# Patient Record
Sex: Male | Born: 1950
Health system: Southern US, Community
[De-identification: ages and names within clinical notes are randomized; demographics above are authoritative.]

## PROBLEM LIST (undated history)

## (undated) DIAGNOSIS — G629 Polyneuropathy, unspecified: Secondary | ICD-10-CM

## (undated) DIAGNOSIS — E785 Hyperlipidemia, unspecified: Secondary | ICD-10-CM

## (undated) DIAGNOSIS — I10 Essential (primary) hypertension: Secondary | ICD-10-CM

## (undated) DIAGNOSIS — M109 Gout, unspecified: Secondary | ICD-10-CM

## (undated) DIAGNOSIS — E119 Type 2 diabetes mellitus without complications: Secondary | ICD-10-CM

## (undated) HISTORY — DX: Polyneuropathy, unspecified: G62.9

## (undated) HISTORY — PX: CHOLECYSTECTOMY: SHX55

## (undated) HISTORY — PX: APPENDECTOMY: SHX54

## (undated) HISTORY — DX: Gout, unspecified: M10.9

## (undated) HISTORY — PX: TONSILLECTOMY: SUR1361

## (undated) HISTORY — PX: GANGLION CYST EXCISION: SHX1691

## (undated) HISTORY — PX: KNEE ARTHROSCOPY: SUR90

---

## 2001-08-18 ENCOUNTER — Encounter: Payer: Self-pay | Admitting: *Deleted

## 2001-08-18 ENCOUNTER — Ambulatory Visit (HOSPITAL_COMMUNITY): Admission: RE | Admit: 2001-08-18 | Discharge: 2001-08-18 | Payer: Self-pay | Admitting: *Deleted

## 2001-09-14 ENCOUNTER — Ambulatory Visit (HOSPITAL_BASED_OUTPATIENT_CLINIC_OR_DEPARTMENT_OTHER): Admission: RE | Admit: 2001-09-14 | Discharge: 2001-09-14 | Payer: Self-pay | Admitting: *Deleted

## 2003-04-30 ENCOUNTER — Encounter: Payer: Self-pay | Admitting: Family Medicine

## 2003-04-30 ENCOUNTER — Ambulatory Visit (HOSPITAL_COMMUNITY): Admission: RE | Admit: 2003-04-30 | Discharge: 2003-04-30 | Payer: Self-pay | Admitting: Family Medicine

## 2003-11-03 HISTORY — PX: COLONOSCOPY: SHX174

## 2003-12-07 ENCOUNTER — Ambulatory Visit (HOSPITAL_COMMUNITY): Admission: RE | Admit: 2003-12-07 | Discharge: 2003-12-07 | Payer: Self-pay | Admitting: Family Medicine

## 2004-01-14 ENCOUNTER — Ambulatory Visit (HOSPITAL_COMMUNITY): Admission: RE | Admit: 2004-01-14 | Discharge: 2004-01-14 | Payer: Self-pay | Admitting: Internal Medicine

## 2006-06-29 ENCOUNTER — Ambulatory Visit (HOSPITAL_COMMUNITY): Admission: RE | Admit: 2006-06-29 | Discharge: 2006-06-29 | Payer: Self-pay | Admitting: Orthopaedic Surgery

## 2014-03-29 ENCOUNTER — Telehealth: Payer: Self-pay

## 2014-03-29 NOTE — Telephone Encounter (Signed)
Pt received a letter in the mail for him to have a TCS. His call back number is [phone].

## 2014-03-29 NOTE — Telephone Encounter (Signed)
LMOM to call.

## 2014-03-30 NOTE — Telephone Encounter (Signed)
Pt is having some diarrhea. Ov with Laban Emperor, NP on 05/03/2014 and to schedule next colonoscopy.

## 2014-05-03 ENCOUNTER — Ambulatory Visit: Payer: Managed Care, Other (non HMO) | Admitting: Gastroenterology

## 2014-05-03 ENCOUNTER — Telehealth: Payer: Self-pay

## 2014-05-03 ENCOUNTER — Other Ambulatory Visit: Payer: Self-pay

## 2014-05-03 ENCOUNTER — Encounter: Payer: Self-pay | Admitting: Gastroenterology

## 2014-05-03 ENCOUNTER — Encounter (INDEPENDENT_AMBULATORY_CARE_PROVIDER_SITE_OTHER): Payer: Self-pay

## 2014-05-03 VITALS — BP 136/76 | HR 56 | Temp 96.8°F | Ht 71.0 in | Wt 227.0 lb

## 2014-05-03 DIAGNOSIS — Z1211 Encounter for screening for malignant neoplasm of colon: Secondary | ICD-10-CM

## 2014-05-03 MED ORDER — PEG-KCL-NACL-NASULF-NA ASC-C 100 G PO SOLR: 1.0000 | ORAL | Status: DC

## 2014-05-03 NOTE — Progress Notes (Signed)
Bruce Abbott presents today to schedule a routine screening colonoscopy. Last in 2005 by Dr. Gala Romney with diminutive rectal polyp, internal hemorrhoids, left-sided diverticula. Path not available at time of visit. He has no problems and wonders why he needed a visit today for a routine 10-year-screening colonoscopy. Apparently, he has had looser stool since his cholecystectomy, but this is unchanged. He has no rectal bleeding, abdominal pain, or any significant change in bowel habits. He started on Metformin with his PCP several months ago, which has affected his overall bowel pattern, which is likely a class side effect. He has no concerning signs/symptoms.   Discussed with patient briefly today. Will triage as he is straight-forward and without any new problems. No charge.  Orvil Feil, ANP-BC [facility]

## 2014-05-03 NOTE — Addendum Note (Signed)
Addended by: Everardo All on: 05/03/2014 10:32 AM   Modules accepted: Orders

## 2014-05-03 NOTE — Telephone Encounter (Signed)
Rx sent and instructions given to pt.

## 2014-05-03 NOTE — Telephone Encounter (Signed)
Gastroenterology Pre-Procedure Review  Request Date: 05/03/2014 Requesting Physician: On Recall  Pt's last colonoscopy was 01/2004 by Dr. Gala Romney  PATIENT REVIEW QUESTIONS: The patient responded to the following health history questions as indicated:    1. Diabetes Melitis: YES 2. Joint replacements in the past 12 months: no 3. Major health problems in the past 3 months: no 4. Has an artificial valve or MVP: no 5. Has a defibrillator: no 6. Has been advised in past to take antibiotics in advance of a procedure like teeth cleaning: no    MEDICATIONS & ALLERGIES:    Patient reports the following regarding taking any blood thinners:   Plavix? no Aspirin? YES Coumadin? NO  Patient confirms/reports the following medications:  Current Outpatient Prescriptions  Medication Sig Dispense Refill  . allopurinol (ZYLOPRIM) 100 MG tablet Take 100 mg by mouth daily.      Marland Kitchen aspirin EC 81 MG tablet Take 81 mg by mouth daily.      . Fish Oil-Krill Oil (KRILL OIL PLUS PO) Take by mouth. Takes 100 mg bid      . lisinopril-hydrochlorothiazide (PRINZIDE,ZESTORETIC) 10-12.5 MG per tablet Take 1 tablet by mouth daily.      . metFORMIN (GLUMETZA) 500 MG (MOD) 24 hr tablet Take 500 mg by mouth 2 (two) times daily with a meal.      . Omega-3 Fatty Acids (FISH OIL) 1000 MG CAPS Take by mouth.      . pravastatin (PRAVACHOL) 10 MG tablet Take 10 mg by mouth daily.      . peg 3350 powder (MOVIPREP) 100 G SOLR Take 1 kit (200 g total) by mouth as directed.  1 kit  0   No current facility-administered medications for this visit.    Patient confirms/reports the following allergies:  No Known Allergies  No orders of the defined types were placed in this encounter.    AUTHORIZATION INFORMATION Primary Insurance:   ID #:   Group #:  Pre-Cert / Auth required:  Pre-Cert / Auth #:   Secondary Insurance:  ID #:   Group #:  Pre-Cert / Auth required:  Pre-Cert / Auth #:   SCHEDULE INFORMATION: Procedure has  been scheduled as follows:  Date:  05/29/2014        Time: 8:45 AM Location: [facility] Short Stay  This Gastroenterology Pre-Precedure Review Form is being routed to the following provider(s): R. Garfield Cornea, MD

## 2014-05-03 NOTE — Telephone Encounter (Signed)
Appropriate. No diabetes medications day of procedure.

## 2014-05-03 NOTE — Patient Instructions (Signed)
We have scheduled you for a colonoscopy with Dr. Rourk.  Further recommendations to follow!   

## 2014-05-24 ENCOUNTER — Telehealth: Payer: Self-pay

## 2014-05-24 NOTE — Telephone Encounter (Signed)
I called to see if pt's insurance required a PA for screening colonoscopy to be done on 05/29/2014 . I called [employer] at [phone] and was told they could not locate him.  His number that I have is [id number].  I called and left message on his Vm for a return call this afternoon before 5:00 pm or tomorrow before noon so I can confirm his insurance. Need to know where he works also.

## 2014-05-25 ENCOUNTER — Encounter (HOSPITAL_COMMUNITY): Payer: Self-pay | Admitting: Pharmacy Technician

## 2014-05-25 NOTE — Telephone Encounter (Signed)
I called [employer] and got the automation. PA not required for screening colonoscopy. Reference # [id number].

## 2014-06-05 ENCOUNTER — Telehealth: Payer: Self-pay

## 2014-06-05 NOTE — Telephone Encounter (Signed)
I called pt to update medication list. He is now taking Zetia 10 mg instead of the Pravastatin. Changes made on the med list.

## 2014-06-05 NOTE — Telephone Encounter (Signed)
Appropriate. No diabetes medications the day of the procedure.  

## 2014-06-06 NOTE — Telephone Encounter (Signed)
Pt aware.

## 2014-06-11 NOTE — Progress Notes (Signed)
Patient usually takes Metformin in afternoon and at night. Per Dr. Gala Romney patient may take afternoon dose with ample amounts of hydration (with sugar, if needed.) Patient to monitor blood glucose. Skip night time dose of Metformin. No other orders at this time.

## 2014-06-12 ENCOUNTER — Encounter (HOSPITAL_COMMUNITY)
Admission: RE | Disposition: A | Discharge status: Home / Self Care | Payer: Self-pay | Source: Ambulatory Visit | Attending: Internal Medicine

## 2014-06-12 ENCOUNTER — Ambulatory Visit (HOSPITAL_COMMUNITY)
Admission: RE | Admit: 2014-06-12 | Discharge: 2014-06-12 | Disposition: A | Discharge status: Home / Self Care | Payer: Managed Care, Other (non HMO) | Source: Ambulatory Visit | Attending: Internal Medicine | Admitting: Internal Medicine

## 2014-06-12 ENCOUNTER — Encounter (HOSPITAL_COMMUNITY): Payer: Self-pay | Admitting: *Deleted

## 2014-06-12 DIAGNOSIS — K621 Rectal polyp: Secondary | ICD-10-CM

## 2014-06-12 DIAGNOSIS — D126 Benign neoplasm of colon, unspecified: Secondary | ICD-10-CM | POA: Insufficient documentation

## 2014-06-12 DIAGNOSIS — Z1211 Encounter for screening for malignant neoplasm of colon: Secondary | ICD-10-CM | POA: Insufficient documentation

## 2014-06-12 DIAGNOSIS — K5730 Diverticulosis of large intestine without perforation or abscess without bleeding: Secondary | ICD-10-CM | POA: Diagnosis not present

## 2014-06-12 DIAGNOSIS — K620 Anal polyp: Secondary | ICD-10-CM

## 2014-06-12 HISTORY — DX: Hyperlipidemia, unspecified: E78.5

## 2014-06-12 HISTORY — DX: Type 2 diabetes mellitus without complications: E11.9

## 2014-06-12 HISTORY — DX: Essential (primary) hypertension: I10

## 2014-06-12 HISTORY — PX: COLONOSCOPY: SHX5424

## 2014-06-12 LAB — GLUCOSE, CAPILLARY: GLUCOSE-CAPILLARY: 142 mg/dL — AB (ref 70–99)

## 2014-06-12 SURGERY — COLONOSCOPY
Anesthesia: Moderate Sedation

## 2014-06-12 MED ORDER — MIDAZOLAM HCL 5 MG/5ML IJ SOLN
INTRAMUSCULAR | Status: DC | PRN
  Administered 2014-06-12: 2 mg via INTRAVENOUS

## 2014-06-12 MED ORDER — SIMETHICONE 40 MG/0.6ML PO SUSP
ORAL | Status: DC | PRN
  Administered 2014-06-12: 08:00:00

## 2014-06-12 MED ORDER — LIDOCAINE VISCOUS 2 % MT SOLN
OROMUCOSAL | Status: AC
  Filled 2014-06-12: qty 15

## 2014-06-12 MED ORDER — MEPERIDINE HCL 100 MG/ML IJ SOLN
INTRAMUSCULAR | Status: AC
  Filled 2014-06-12: qty 2

## 2014-06-12 MED ORDER — MIDAZOLAM HCL 5 MG/5ML IJ SOLN
INTRAMUSCULAR | Status: AC
  Filled 2014-06-12: qty 10

## 2014-06-12 MED ORDER — ONDANSETRON HCL 4 MG/2ML IJ SOLN
INTRAMUSCULAR | Status: DC
Start: 2014-06-12 — End: 2014-06-12
  Filled 2014-06-12: qty 2

## 2014-06-12 MED ORDER — MEPERIDINE HCL 100 MG/ML IJ SOLN
INTRAMUSCULAR | Status: DC | PRN
  Administered 2014-06-12: 50 mg via INTRAVENOUS

## 2014-06-12 MED ORDER — SODIUM CHLORIDE 0.9 % IV SOLN
INTRAVENOUS | Status: DC
  Administered 2014-06-12: 07:00:00 via INTRAVENOUS

## 2014-06-12 MED ORDER — ONDANSETRON HCL 4 MG/2ML IJ SOLN
INTRAMUSCULAR | Status: DC | PRN
  Administered 2014-06-12: 4 mg via INTRAVENOUS

## 2014-06-12 NOTE — H&P (Signed)
_0 @   Primary Care Physician:  Glo Herring., MD Primary Gastroenterologist:  Dr. Gala Romney  Pre-Procedure History & Physical: HPI:  Bruce Abbott is a 63 y.o. male is here for a screening colonoscopy. Negative colonoscopy 10 years ago. No bowel symptoms. No family history of colon cancer.  Past Medical History  Diagnosis Date  . Diabetes mellitus without complication   . Hypertension   . Hyperlipemia     Past Surgical History  Procedure Laterality Date  . Colonoscopy  2005    Dr. Gala Romney: internal hemorrhoids, diminutive rectal polyp, path not available. Left-sided diverticula  . Appendectomy    . Tonsillectomy    . Cholecystectomy    . Knee arthroscopy Left   . Ganglion cyst excision      Prior to Admission medications   Medication Sig Start Date End Date Taking? Authorizing Provider  allopurinol (ZYLOPRIM) 300 MG tablet Take 300 mg by mouth daily.   Yes Historical Provider, MD  aspirin EC 81 MG tablet Take 81 mg by mouth daily.   Yes Historical Provider, MD  Coenzyme Q10 (CO Q 10) 100 MG CAPS Take 1 capsule by mouth daily.   Yes Historical Provider, MD  ezetimibe (ZETIA) 10 MG tablet Take 10 mg by mouth daily.   Yes Historical Provider, MD  lisinopril-hydrochlorothiazide (PRINZIDE,ZESTORETIC) 10-12.5 MG per tablet Take 1 tablet by mouth daily.   Yes Historical Provider, MD  metFORMIN (GLUMETZA) 500 MG (MOD) 24 hr tablet Take 500 mg by mouth 2 (two) times daily with a meal.   Yes Historical Provider, MD  Omega-3 Fatty Acids (FISH OIL) 1200 MG CAPS Take 1 capsule by mouth daily.   Yes Historical Provider, MD  Omega-3 Krill Oil 500 MG CAPS Take 2 capsules by mouth daily.   Yes Historical Provider, MD  peg 3350 powder (MOVIPREP) 100 G SOLR Take 1 kit (200 g total) by mouth as directed. 05/03/14  Yes Daneil Dolin, MD  pravastatin (PRAVACHOL) 20 MG tablet Take 20 mg by mouth daily.   Yes Historical Provider, MD    Allergies as of 05/03/2014  . (No Known Allergies)     History reviewed. No pertinent family history.  History   Social History  . Marital Status: Married    Spouse Name: N/A    Number of Children: N/A  . Years of Education: N/A   Occupational History  . Not on file.   Social History Main Topics  . Smoking status: Former Research scientist (life sciences)  . Smokeless tobacco: Not on file     Comment: Quit x 7-8 years  . Alcohol Use: Yes     Comment: occ  . Drug Use: No  . Sexual Activity: Not on file   Other Topics Concern  . Not on file   Social History Narrative  . No narrative on file    Review of Systems: See HPI, otherwise negative ROS  Physical Exam: BP 115/68  Pulse 55  Temp(Src) 97.9 F (36.6 C) (Oral)  Resp 16  SpO2 96% General:   Alert,  Well-developed, well-nourished, pleasant and cooperative in NAD Head:  Normocephalic and atraumatic. Eyes:  Sclera clear, no icterus.   Conjunctiva pink. Ears:  Normal auditory acuity. Nose:  No deformity, discharge,  or lesions. Mouth:  No deformity or lesions, dentition normal. Neck:  Supple; no masses or thyromegaly. Lungs:  Clear throughout to auscultation.   No wheezes, crackles, or rhonchi. No acute distress. Heart:  Regular rate and rhythm; no murmurs, clicks, rubs,  or  gallops. Abdomen:  Soft, nontender and nondistended. No masses, hepatosplenomegaly or hernias noted. Normal bowel sounds, without guarding, and without rebound.   Msk:  Symmetrical without gross deformities. Normal posture. Pulses:  Normal pulses noted. Extremities:  Without clubbing or edema. Neurologic:  Alert and  oriented x4;  grossly normal neurologically. Skin:  Intact without significant lesions or rashes. Cervical Nodes:  No significant cervical adenopathy. Psych:  Alert and cooperative. Normal mood and affect.  Impression/Plan: [name] is now here to undergo a screening colonoscopy.  Average risk screening examination  Risks, benefits, limitations, imponderables and alternatives regarding colonoscopy  have been reviewed with the patient. Questions have been answered. All parties agreeable.     Notice:  This dictation was prepared with Dragon dictation along with smaller phrase technology. Any transcriptional errors that result from this process are unintentional and may not be corrected upon review.

## 2014-06-12 NOTE — Discharge Instructions (Signed)
Colonoscopy Discharge Instructions  Read the instructions outlined below and refer to this sheet in the next few weeks. These discharge instructions provide you with general information on caring for yourself after you leave the hospital. Your doctor may also give you specific instructions. While your treatment has been planned according to the most current medical practices available, unavoidable complications occasionally occur. If you have any problems or questions after discharge, call Dr. Gala Romney at [phone]. ACTIVITY  You may resume your regular activity, but move at a slower pace for the next 24 hours.   Take frequent rest periods for the next 24 hours.   Walking will help get rid of the air and reduce the bloated feeling in your belly (abdomen).   No driving for 24 hours (because of the medicine (anesthesia) used during the test).    Do not sign any important legal documents or operate any machinery for 24 hours (because of the anesthesia used during the test).  NUTRITION  Drink plenty of fluids.   You may resume your normal diet as instructed by your doctor.   Begin with a light meal and progress to your normal diet. Heavy or fried foods are harder to digest and may make you feel sick to your stomach (nauseated).   Avoid alcoholic beverages for 24 hours or as instructed.  MEDICATIONS  You may resume your normal medications unless your doctor tells you otherwise.  WHAT YOU CAN EXPECT TODAY  Some feelings of bloating in the abdomen.   Passage of more gas than usual.   Spotting of blood in your stool or on the toilet paper.  IF YOU HAD POLYPS REMOVED DURING THE COLONOSCOPY:  No aspirin products for 7 days or as instructed.   No alcohol for 7 days or as instructed.   Eat a soft diet for the next 24 hours.  FINDING OUT THE RESULTS OF YOUR TEST Not all test results are available during your visit. If your test results are not back during the visit, make an appointment  with your caregiver to find out the results. Do not assume everything is normal if you have not heard from your caregiver or the medical facility. It is important for you to follow up on all of your test results.  SEEK IMMEDIATE MEDICAL ATTENTION IF:  You have more than a spotting of blood in your stool.   Your belly is swollen (abdominal distention).   You are nauseated or vomiting.   You have a temperature over 101.   You have abdominal pain or discomfort that is severe or gets worse throughout the day.     Diverticulosis and polyp information provided  Further recommendations to follow pending review of pathology  Diverticulosis Diverticulosis is the condition that develops when small pouches (diverticula) form in the wall of your colon. Your colon, or large intestine, is where water is absorbed and stool is formed. The pouches form when the inside layer of your colon pushes through weak spots in the outer layers of your colon. CAUSES  No one knows exactly what causes diverticulosis. RISK FACTORS  Being older than 70. Your risk for this condition increases with age. Diverticulosis is rare in people younger than 40 years. By age 67, almost everyone has it.  Eating a low-fiber diet.  Being frequently constipated.  Being overweight.  Not getting enough exercise.  Smoking.  Taking over-the-counter pain medicines, like aspirin and ibuprofen. SYMPTOMS  Most people with diverticulosis do not have symptoms. DIAGNOSIS  Because  diverticulosis often has no symptoms, health care providers often discover the condition during an exam for other colon problems. In many cases, a health care provider will diagnose diverticulosis while using a flexible scope to examine the colon (colonoscopy). TREATMENT  If you have never developed an infection related to diverticulosis, you may not need treatment. If you have had an infection before, treatment may include:  Eating more fruits,  vegetables, and grains.  Taking a fiber supplement.  Taking a live bacteria supplement (probiotic).  Taking medicine to relax your colon. HOME CARE INSTRUCTIONS   Drink at least 6-8 glasses of water each day to prevent constipation.  Try not to strain when you have a bowel movement.  Keep all follow-up appointments. If you have had an infection before:  Increase the fiber in your diet as directed by your health care provider or dietitian.  Take a dietary fiber supplement if your health care provider approves.  Only take medicines as directed by your health care provider. SEEK MEDICAL CARE IF:   You have abdominal pain.  You have bloating.  You have cramps.  You have not gone to the bathroom in 3 days. SEEK IMMEDIATE MEDICAL CARE IF:   Your pain gets worse.  Yourbloating becomes very bad.  You have a fever or chills, and your symptoms suddenly get worse.  You begin vomiting.  You have bowel movements that are bloody or black. MAKE SURE YOU:  Understand these instructions.  Will watch your condition.  Will get help right away if you are not doing well or get worse.  Colon Polyps Polyps are lumps of extra tissue growing inside the body. Polyps can grow in the large intestine (colon). Most colon polyps are noncancerous (benign). However, some colon polyps can become cancerous over time. Polyps that are larger than a pea may be harmful. To be safe, caregivers remove and test all polyps. CAUSES  Polyps form when mutations in the genes cause your cells to grow and divide even though no more tissue is needed. RISK FACTORS There are a number of risk factors that can increase your chances of getting colon polyps. They include:  Being older than 50 years.  Family history of colon polyps or colon cancer.  Long-term colon diseases, such as colitis or Crohn disease.  Being overweight.  Smoking.  Being inactive.  Drinking too much alcohol. SYMPTOMS  Most small  polyps do not cause symptoms. If symptoms are present, they may include:  Blood in the stool. The stool may look dark red or black.  Constipation or diarrhea that lasts longer than 1 week. DIAGNOSIS People often do not know they have polyps until their caregiver finds them during a regular checkup. Your caregiver can use 4 tests to check for polyps:  Digital rectal exam. The caregiver wears gloves and feels inside the rectum. This test would find polyps only in the rectum.  Barium enema. The caregiver puts a liquid called barium into your rectum before taking X-rays of your colon. Barium makes your colon look white. Polyps are dark, so they are easy to see in the X-ray pictures.  Sigmoidoscopy. A thin, flexible tube (sigmoidoscope) is placed into your rectum. The sigmoidoscope has a light and tiny camera in it. The caregiver uses the sigmoidoscope to look at the last third of your colon.  Colonoscopy. This test is like sigmoidoscopy, but the caregiver looks at the entire colon. This is the most common method for finding and removing polyps. TREATMENT  Any  polyps will be removed during a sigmoidoscopy or colonoscopy. The polyps are then tested for cancer. PREVENTION  To help lower your risk of getting more colon polyps:  Eat plenty of fruits and vegetables. Avoid eating fatty foods.  Do not smoke.  Avoid drinking alcohol.  Exercise every day.  Lose weight if recommended by your caregiver.  Eat plenty of calcium and folate. Foods that are rich in calcium include milk, cheese, and broccoli. Foods that are rich in folate include chickpeas, kidney beans, and spinach. HOME CARE INSTRUCTIONS Keep all follow-up appointments as directed by your caregiver. You may need periodic exams to check for polyps. SEEK MEDICAL CARE IF: You notice bleeding during a bowel movement.

## 2014-06-12 NOTE — Op Note (Signed)
[facility] [address] [place], [address]   COLONOSCOPY PROCEDURE REPORT  PATIENT: Bruce Abbott, Bruce Abbott  MR#:         [id number] BIRTHDATE: 11-Feb-1951 , 14  yrs. old GENDER: Male ENDOSCOPIST: R.  Garfield Cornea, MD FACP FACG REFERRED BY:  Kerin Perna, M.D. PROCEDURE DATE:  06/12/2014 PROCEDURE:     Colonoscopy with snare polypectomy and polyp ablation  INDICATIONS: Average risk colorectal cancer screening examination  INFORMED CONSENT:  The risks, benefits, alternatives and imponderables including but not limited to bleeding, perforation as well as the possibility of a missed lesion have been reviewed.  The potential for biopsy, lesion removal, etc. have also been discussed.  Questions have been answered.  All parties agreeable. Please see the history and physical in the medical record for more information.  MEDICATIONS: Versed 2 mg IV and Demerol 50 mg IV in divided doses. Zofran 4 mg IV.  DESCRIPTION OF PROCEDURE:  After a digital rectal exam was performed, the EG-[id number] ([id number]) and EC-3890Li ([id number]) colonoscope was advanced from the anus through the rectum and colon to the area of the cecum, ileocecal valve and appendiceal orifice. The cecum was deeply intubated.  These structures were well-seen and photographed for the record.  From the level of the cecum and ileocecal valve, the scope was slowly and cautiously withdrawn. The mucosal surfaces were carefully surveyed utilizing scope tip deflection to facilitate fold flattening as needed.  The scope was pulled down into the rectum where a thorough examination including retroflexion was performed.    FINDINGS:  Adequate preparation. (2)  4 mm polyps in the rectum at 6 cm from the anal verge. (1) diminutive polyp just inside the anal verge; otherwise, the remainder of the rectal mucosa appeared normal. The patient had scattered left-sided diverticula; the remainder of colonic mucosa appeared  normal.  THERAPEUTIC / DIAGNOSTIC MANEUVERS PERFORMED:  The 2 larger rectal polyp were removed with hot snare cautery technique. The diminutive polyp was ablated with the tip of a hot snare loop.  COMPLICATIONS: none  CECAL WITHDRAWAL TIME:  11 minutes  IMPRESSION:  Rectal polyps-treated/removed as described above. Colonic diverticulosis.  RECOMMENDATIONS: Followup on pathology.   _______________________________ eSigned:  R. Garfield Cornea, MD FACP [facility] 06/12/2014 8:24 AM   CC:

## 2014-06-13 ENCOUNTER — Encounter: Payer: Self-pay | Admitting: Internal Medicine

## 2014-06-15 ENCOUNTER — Encounter (HOSPITAL_COMMUNITY): Payer: Self-pay | Admitting: Internal Medicine

## 2014-12-11 DIAGNOSIS — Z6829 Body mass index (BMI) 29.0-29.9, adult: Secondary | ICD-10-CM | POA: Diagnosis not present

## 2014-12-11 DIAGNOSIS — E119 Type 2 diabetes mellitus without complications: Secondary | ICD-10-CM | POA: Diagnosis not present

## 2014-12-11 DIAGNOSIS — I10 Essential (primary) hypertension: Secondary | ICD-10-CM | POA: Diagnosis not present

## 2014-12-11 DIAGNOSIS — E782 Mixed hyperlipidemia: Secondary | ICD-10-CM | POA: Diagnosis not present

## 2015-02-14 DIAGNOSIS — D1039 Benign neoplasm of other parts of mouth: Secondary | ICD-10-CM | POA: Diagnosis not present

## 2015-02-14 DIAGNOSIS — K1329 Other disturbances of oral epithelium, including tongue: Secondary | ICD-10-CM | POA: Diagnosis not present

## 2015-02-14 DIAGNOSIS — K121 Other forms of stomatitis: Secondary | ICD-10-CM | POA: Diagnosis not present

## 2015-03-12 DIAGNOSIS — E119 Type 2 diabetes mellitus without complications: Secondary | ICD-10-CM | POA: Diagnosis not present

## 2015-03-12 DIAGNOSIS — I10 Essential (primary) hypertension: Secondary | ICD-10-CM | POA: Diagnosis not present

## 2015-03-12 DIAGNOSIS — Z23 Encounter for immunization: Secondary | ICD-10-CM | POA: Diagnosis not present

## 2015-03-12 DIAGNOSIS — Z6830 Body mass index (BMI) 30.0-30.9, adult: Secondary | ICD-10-CM | POA: Diagnosis not present

## 2015-03-12 DIAGNOSIS — E782 Mixed hyperlipidemia: Secondary | ICD-10-CM | POA: Diagnosis not present

## 2015-03-12 DIAGNOSIS — E669 Obesity, unspecified: Secondary | ICD-10-CM | POA: Diagnosis not present

## 2015-03-12 DIAGNOSIS — E6609 Other obesity due to excess calories: Secondary | ICD-10-CM | POA: Diagnosis not present

## 2015-04-17 DIAGNOSIS — E6609 Other obesity due to excess calories: Secondary | ICD-10-CM | POA: Diagnosis not present

## 2015-04-17 DIAGNOSIS — Z6830 Body mass index (BMI) 30.0-30.9, adult: Secondary | ICD-10-CM | POA: Diagnosis not present

## 2015-04-17 DIAGNOSIS — M109 Gout, unspecified: Secondary | ICD-10-CM | POA: Diagnosis not present

## 2015-04-17 DIAGNOSIS — I10 Essential (primary) hypertension: Secondary | ICD-10-CM | POA: Diagnosis not present

## 2015-06-17 DIAGNOSIS — E6609 Other obesity due to excess calories: Secondary | ICD-10-CM | POA: Diagnosis not present

## 2015-06-17 DIAGNOSIS — N182 Chronic kidney disease, stage 2 (mild): Secondary | ICD-10-CM | POA: Diagnosis not present

## 2015-06-17 DIAGNOSIS — E782 Mixed hyperlipidemia: Secondary | ICD-10-CM | POA: Diagnosis not present

## 2015-06-17 DIAGNOSIS — I10 Essential (primary) hypertension: Secondary | ICD-10-CM | POA: Diagnosis not present

## 2015-06-17 DIAGNOSIS — E119 Type 2 diabetes mellitus without complications: Secondary | ICD-10-CM | POA: Diagnosis not present

## 2015-06-17 DIAGNOSIS — Z6830 Body mass index (BMI) 30.0-30.9, adult: Secondary | ICD-10-CM | POA: Diagnosis not present

## 2015-06-17 DIAGNOSIS — Z1389 Encounter for screening for other disorder: Secondary | ICD-10-CM | POA: Diagnosis not present

## 2015-09-17 DIAGNOSIS — E119 Type 2 diabetes mellitus without complications: Secondary | ICD-10-CM | POA: Diagnosis not present

## 2015-09-17 DIAGNOSIS — E6609 Other obesity due to excess calories: Secondary | ICD-10-CM | POA: Diagnosis not present

## 2015-09-17 DIAGNOSIS — J019 Acute sinusitis, unspecified: Secondary | ICD-10-CM | POA: Diagnosis not present

## 2015-09-17 DIAGNOSIS — Z1389 Encounter for screening for other disorder: Secondary | ICD-10-CM | POA: Diagnosis not present

## 2015-09-17 DIAGNOSIS — E039 Hypothyroidism, unspecified: Secondary | ICD-10-CM | POA: Diagnosis not present

## 2015-09-17 DIAGNOSIS — I10 Essential (primary) hypertension: Secondary | ICD-10-CM | POA: Diagnosis not present

## 2015-09-17 DIAGNOSIS — E782 Mixed hyperlipidemia: Secondary | ICD-10-CM | POA: Diagnosis not present

## 2015-09-17 DIAGNOSIS — Z6830 Body mass index (BMI) 30.0-30.9, adult: Secondary | ICD-10-CM | POA: Diagnosis not present

## 2015-09-17 DIAGNOSIS — N400 Enlarged prostate without lower urinary tract symptoms: Secondary | ICD-10-CM | POA: Diagnosis not present

## 2015-10-01 DIAGNOSIS — E119 Type 2 diabetes mellitus without complications: Secondary | ICD-10-CM | POA: Diagnosis not present

## 2015-10-01 DIAGNOSIS — H2589 Other age-related cataract: Secondary | ICD-10-CM | POA: Diagnosis not present

## 2015-10-01 DIAGNOSIS — H524 Presbyopia: Secondary | ICD-10-CM | POA: Diagnosis not present

## 2015-10-01 DIAGNOSIS — H52223 Regular astigmatism, bilateral: Secondary | ICD-10-CM | POA: Diagnosis not present

## 2015-10-01 DIAGNOSIS — H5213 Myopia, bilateral: Secondary | ICD-10-CM | POA: Diagnosis not present

## 2015-10-21 DIAGNOSIS — Z1389 Encounter for screening for other disorder: Secondary | ICD-10-CM | POA: Diagnosis not present

## 2015-10-21 DIAGNOSIS — E6609 Other obesity due to excess calories: Secondary | ICD-10-CM | POA: Diagnosis not present

## 2015-10-21 DIAGNOSIS — M5431 Sciatica, right side: Secondary | ICD-10-CM | POA: Diagnosis not present

## 2015-10-21 DIAGNOSIS — E119 Type 2 diabetes mellitus without complications: Secondary | ICD-10-CM | POA: Diagnosis not present

## 2015-10-21 DIAGNOSIS — Z23 Encounter for immunization: Secondary | ICD-10-CM | POA: Diagnosis not present

## 2015-10-21 DIAGNOSIS — Z6831 Body mass index (BMI) 31.0-31.9, adult: Secondary | ICD-10-CM | POA: Diagnosis not present

## 2015-11-22 DIAGNOSIS — Z1389 Encounter for screening for other disorder: Secondary | ICD-10-CM | POA: Diagnosis not present

## 2015-11-22 DIAGNOSIS — M5416 Radiculopathy, lumbar region: Secondary | ICD-10-CM | POA: Diagnosis not present

## 2015-11-22 DIAGNOSIS — Z6830 Body mass index (BMI) 30.0-30.9, adult: Secondary | ICD-10-CM | POA: Diagnosis not present

## 2015-11-22 DIAGNOSIS — I10 Essential (primary) hypertension: Secondary | ICD-10-CM | POA: Diagnosis not present

## 2015-11-22 DIAGNOSIS — E119 Type 2 diabetes mellitus without complications: Secondary | ICD-10-CM | POA: Diagnosis not present

## 2015-12-04 ENCOUNTER — Other Ambulatory Visit (HOSPITAL_COMMUNITY): Payer: Self-pay | Admitting: Internal Medicine

## 2015-12-09 DIAGNOSIS — G8929 Other chronic pain: Secondary | ICD-10-CM | POA: Diagnosis not present

## 2015-12-09 DIAGNOSIS — M5136 Other intervertebral disc degeneration, lumbar region: Secondary | ICD-10-CM | POA: Diagnosis not present

## 2015-12-09 DIAGNOSIS — M5441 Lumbago with sciatica, right side: Secondary | ICD-10-CM | POA: Diagnosis not present

## 2015-12-11 DIAGNOSIS — R293 Abnormal posture: Secondary | ICD-10-CM | POA: Diagnosis not present

## 2015-12-11 DIAGNOSIS — M2560 Stiffness of unspecified joint, not elsewhere classified: Secondary | ICD-10-CM | POA: Diagnosis not present

## 2015-12-11 DIAGNOSIS — R262 Difficulty in walking, not elsewhere classified: Secondary | ICD-10-CM | POA: Diagnosis not present

## 2015-12-11 DIAGNOSIS — M5441 Lumbago with sciatica, right side: Secondary | ICD-10-CM | POA: Diagnosis not present

## 2015-12-12 DIAGNOSIS — Z1389 Encounter for screening for other disorder: Secondary | ICD-10-CM | POA: Diagnosis not present

## 2015-12-12 DIAGNOSIS — J069 Acute upper respiratory infection, unspecified: Secondary | ICD-10-CM | POA: Diagnosis not present

## 2015-12-12 DIAGNOSIS — Z6831 Body mass index (BMI) 31.0-31.9, adult: Secondary | ICD-10-CM | POA: Diagnosis not present

## 2015-12-12 DIAGNOSIS — J209 Acute bronchitis, unspecified: Secondary | ICD-10-CM | POA: Diagnosis not present

## 2015-12-12 DIAGNOSIS — E6609 Other obesity due to excess calories: Secondary | ICD-10-CM | POA: Diagnosis not present

## 2015-12-13 DIAGNOSIS — M2560 Stiffness of unspecified joint, not elsewhere classified: Secondary | ICD-10-CM | POA: Diagnosis not present

## 2015-12-13 DIAGNOSIS — M5441 Lumbago with sciatica, right side: Secondary | ICD-10-CM | POA: Diagnosis not present

## 2015-12-13 DIAGNOSIS — R262 Difficulty in walking, not elsewhere classified: Secondary | ICD-10-CM | POA: Diagnosis not present

## 2015-12-13 DIAGNOSIS — R293 Abnormal posture: Secondary | ICD-10-CM | POA: Diagnosis not present

## 2015-12-17 DIAGNOSIS — M2560 Stiffness of unspecified joint, not elsewhere classified: Secondary | ICD-10-CM | POA: Diagnosis not present

## 2015-12-17 DIAGNOSIS — M5441 Lumbago with sciatica, right side: Secondary | ICD-10-CM | POA: Diagnosis not present

## 2015-12-17 DIAGNOSIS — R293 Abnormal posture: Secondary | ICD-10-CM | POA: Diagnosis not present

## 2015-12-17 DIAGNOSIS — R262 Difficulty in walking, not elsewhere classified: Secondary | ICD-10-CM | POA: Diagnosis not present

## 2015-12-18 DIAGNOSIS — R262 Difficulty in walking, not elsewhere classified: Secondary | ICD-10-CM | POA: Diagnosis not present

## 2015-12-18 DIAGNOSIS — M5441 Lumbago with sciatica, right side: Secondary | ICD-10-CM | POA: Diagnosis not present

## 2015-12-18 DIAGNOSIS — R293 Abnormal posture: Secondary | ICD-10-CM | POA: Diagnosis not present

## 2015-12-18 DIAGNOSIS — M2560 Stiffness of unspecified joint, not elsewhere classified: Secondary | ICD-10-CM | POA: Diagnosis not present

## 2015-12-20 DIAGNOSIS — R293 Abnormal posture: Secondary | ICD-10-CM | POA: Diagnosis not present

## 2015-12-20 DIAGNOSIS — R262 Difficulty in walking, not elsewhere classified: Secondary | ICD-10-CM | POA: Diagnosis not present

## 2015-12-20 DIAGNOSIS — M5441 Lumbago with sciatica, right side: Secondary | ICD-10-CM | POA: Diagnosis not present

## 2015-12-20 DIAGNOSIS — M2560 Stiffness of unspecified joint, not elsewhere classified: Secondary | ICD-10-CM | POA: Diagnosis not present

## 2015-12-24 DIAGNOSIS — M5441 Lumbago with sciatica, right side: Secondary | ICD-10-CM | POA: Diagnosis not present

## 2015-12-24 DIAGNOSIS — M2560 Stiffness of unspecified joint, not elsewhere classified: Secondary | ICD-10-CM | POA: Diagnosis not present

## 2015-12-24 DIAGNOSIS — E782 Mixed hyperlipidemia: Secondary | ICD-10-CM | POA: Diagnosis not present

## 2015-12-24 DIAGNOSIS — R262 Difficulty in walking, not elsewhere classified: Secondary | ICD-10-CM | POA: Diagnosis not present

## 2015-12-24 DIAGNOSIS — E6609 Other obesity due to excess calories: Secondary | ICD-10-CM | POA: Diagnosis not present

## 2015-12-24 DIAGNOSIS — E119 Type 2 diabetes mellitus without complications: Secondary | ICD-10-CM | POA: Diagnosis not present

## 2015-12-24 DIAGNOSIS — Z1389 Encounter for screening for other disorder: Secondary | ICD-10-CM | POA: Diagnosis not present

## 2015-12-24 DIAGNOSIS — I10 Essential (primary) hypertension: Secondary | ICD-10-CM | POA: Diagnosis not present

## 2015-12-24 DIAGNOSIS — R293 Abnormal posture: Secondary | ICD-10-CM | POA: Diagnosis not present

## 2015-12-24 DIAGNOSIS — Z6830 Body mass index (BMI) 30.0-30.9, adult: Secondary | ICD-10-CM | POA: Diagnosis not present

## 2015-12-26 DIAGNOSIS — R262 Difficulty in walking, not elsewhere classified: Secondary | ICD-10-CM | POA: Diagnosis not present

## 2015-12-26 DIAGNOSIS — M5441 Lumbago with sciatica, right side: Secondary | ICD-10-CM | POA: Diagnosis not present

## 2015-12-26 DIAGNOSIS — R293 Abnormal posture: Secondary | ICD-10-CM | POA: Diagnosis not present

## 2015-12-26 DIAGNOSIS — M2560 Stiffness of unspecified joint, not elsewhere classified: Secondary | ICD-10-CM | POA: Diagnosis not present

## 2015-12-27 DIAGNOSIS — M5441 Lumbago with sciatica, right side: Secondary | ICD-10-CM | POA: Diagnosis not present

## 2015-12-27 DIAGNOSIS — R293 Abnormal posture: Secondary | ICD-10-CM | POA: Diagnosis not present

## 2015-12-27 DIAGNOSIS — R262 Difficulty in walking, not elsewhere classified: Secondary | ICD-10-CM | POA: Diagnosis not present

## 2015-12-27 DIAGNOSIS — M2560 Stiffness of unspecified joint, not elsewhere classified: Secondary | ICD-10-CM | POA: Diagnosis not present

## 2015-12-30 DIAGNOSIS — R293 Abnormal posture: Secondary | ICD-10-CM | POA: Diagnosis not present

## 2015-12-30 DIAGNOSIS — M2560 Stiffness of unspecified joint, not elsewhere classified: Secondary | ICD-10-CM | POA: Diagnosis not present

## 2015-12-30 DIAGNOSIS — R262 Difficulty in walking, not elsewhere classified: Secondary | ICD-10-CM | POA: Diagnosis not present

## 2015-12-30 DIAGNOSIS — M5441 Lumbago with sciatica, right side: Secondary | ICD-10-CM | POA: Diagnosis not present

## 2016-01-01 DIAGNOSIS — M2560 Stiffness of unspecified joint, not elsewhere classified: Secondary | ICD-10-CM | POA: Diagnosis not present

## 2016-01-01 DIAGNOSIS — M5441 Lumbago with sciatica, right side: Secondary | ICD-10-CM | POA: Diagnosis not present

## 2016-01-01 DIAGNOSIS — R262 Difficulty in walking, not elsewhere classified: Secondary | ICD-10-CM | POA: Diagnosis not present

## 2016-01-01 DIAGNOSIS — R293 Abnormal posture: Secondary | ICD-10-CM | POA: Diagnosis not present

## 2016-01-03 DIAGNOSIS — R293 Abnormal posture: Secondary | ICD-10-CM | POA: Diagnosis not present

## 2016-01-03 DIAGNOSIS — R262 Difficulty in walking, not elsewhere classified: Secondary | ICD-10-CM | POA: Diagnosis not present

## 2016-01-03 DIAGNOSIS — M2560 Stiffness of unspecified joint, not elsewhere classified: Secondary | ICD-10-CM | POA: Diagnosis not present

## 2016-01-03 DIAGNOSIS — M5441 Lumbago with sciatica, right side: Secondary | ICD-10-CM | POA: Diagnosis not present

## 2016-01-06 DIAGNOSIS — M2560 Stiffness of unspecified joint, not elsewhere classified: Secondary | ICD-10-CM | POA: Diagnosis not present

## 2016-01-06 DIAGNOSIS — R262 Difficulty in walking, not elsewhere classified: Secondary | ICD-10-CM | POA: Diagnosis not present

## 2016-01-06 DIAGNOSIS — M5441 Lumbago with sciatica, right side: Secondary | ICD-10-CM | POA: Diagnosis not present

## 2016-01-06 DIAGNOSIS — R293 Abnormal posture: Secondary | ICD-10-CM | POA: Diagnosis not present

## 2016-01-08 DIAGNOSIS — R262 Difficulty in walking, not elsewhere classified: Secondary | ICD-10-CM | POA: Diagnosis not present

## 2016-01-08 DIAGNOSIS — G8929 Other chronic pain: Secondary | ICD-10-CM | POA: Diagnosis not present

## 2016-01-08 DIAGNOSIS — M5441 Lumbago with sciatica, right side: Secondary | ICD-10-CM | POA: Diagnosis not present

## 2016-01-08 DIAGNOSIS — M2560 Stiffness of unspecified joint, not elsewhere classified: Secondary | ICD-10-CM | POA: Diagnosis not present

## 2016-01-08 DIAGNOSIS — M5136 Other intervertebral disc degeneration, lumbar region: Secondary | ICD-10-CM | POA: Diagnosis not present

## 2016-01-08 DIAGNOSIS — R293 Abnormal posture: Secondary | ICD-10-CM | POA: Diagnosis not present

## 2016-01-09 DIAGNOSIS — E6609 Other obesity due to excess calories: Secondary | ICD-10-CM | POA: Diagnosis not present

## 2016-01-09 DIAGNOSIS — Z0000 Encounter for general adult medical examination without abnormal findings: Secondary | ICD-10-CM | POA: Diagnosis not present

## 2016-01-09 DIAGNOSIS — Z6830 Body mass index (BMI) 30.0-30.9, adult: Secondary | ICD-10-CM | POA: Diagnosis not present

## 2016-01-09 DIAGNOSIS — E119 Type 2 diabetes mellitus without complications: Secondary | ICD-10-CM | POA: Diagnosis not present

## 2016-01-09 DIAGNOSIS — Z1389 Encounter for screening for other disorder: Secondary | ICD-10-CM | POA: Diagnosis not present

## 2016-01-10 DIAGNOSIS — R293 Abnormal posture: Secondary | ICD-10-CM | POA: Diagnosis not present

## 2016-01-10 DIAGNOSIS — R262 Difficulty in walking, not elsewhere classified: Secondary | ICD-10-CM | POA: Diagnosis not present

## 2016-01-10 DIAGNOSIS — M2560 Stiffness of unspecified joint, not elsewhere classified: Secondary | ICD-10-CM | POA: Diagnosis not present

## 2016-01-10 DIAGNOSIS — M5441 Lumbago with sciatica, right side: Secondary | ICD-10-CM | POA: Diagnosis not present

## 2016-01-13 DIAGNOSIS — M2560 Stiffness of unspecified joint, not elsewhere classified: Secondary | ICD-10-CM | POA: Diagnosis not present

## 2016-01-13 DIAGNOSIS — M5441 Lumbago with sciatica, right side: Secondary | ICD-10-CM | POA: Diagnosis not present

## 2016-01-13 DIAGNOSIS — R262 Difficulty in walking, not elsewhere classified: Secondary | ICD-10-CM | POA: Diagnosis not present

## 2016-01-13 DIAGNOSIS — R293 Abnormal posture: Secondary | ICD-10-CM | POA: Diagnosis not present

## 2016-01-15 DIAGNOSIS — R262 Difficulty in walking, not elsewhere classified: Secondary | ICD-10-CM | POA: Diagnosis not present

## 2016-01-15 DIAGNOSIS — M5441 Lumbago with sciatica, right side: Secondary | ICD-10-CM | POA: Diagnosis not present

## 2016-01-15 DIAGNOSIS — R293 Abnormal posture: Secondary | ICD-10-CM | POA: Diagnosis not present

## 2016-01-15 DIAGNOSIS — M2560 Stiffness of unspecified joint, not elsewhere classified: Secondary | ICD-10-CM | POA: Diagnosis not present

## 2016-01-17 DIAGNOSIS — R293 Abnormal posture: Secondary | ICD-10-CM | POA: Diagnosis not present

## 2016-01-17 DIAGNOSIS — M2560 Stiffness of unspecified joint, not elsewhere classified: Secondary | ICD-10-CM | POA: Diagnosis not present

## 2016-01-17 DIAGNOSIS — M5441 Lumbago with sciatica, right side: Secondary | ICD-10-CM | POA: Diagnosis not present

## 2016-01-17 DIAGNOSIS — R262 Difficulty in walking, not elsewhere classified: Secondary | ICD-10-CM | POA: Diagnosis not present

## 2016-01-20 DIAGNOSIS — R262 Difficulty in walking, not elsewhere classified: Secondary | ICD-10-CM | POA: Diagnosis not present

## 2016-01-20 DIAGNOSIS — M5441 Lumbago with sciatica, right side: Secondary | ICD-10-CM | POA: Diagnosis not present

## 2016-01-20 DIAGNOSIS — R293 Abnormal posture: Secondary | ICD-10-CM | POA: Diagnosis not present

## 2016-01-20 DIAGNOSIS — M2560 Stiffness of unspecified joint, not elsewhere classified: Secondary | ICD-10-CM | POA: Diagnosis not present

## 2016-01-23 DIAGNOSIS — R262 Difficulty in walking, not elsewhere classified: Secondary | ICD-10-CM | POA: Diagnosis not present

## 2016-01-23 DIAGNOSIS — M5441 Lumbago with sciatica, right side: Secondary | ICD-10-CM | POA: Diagnosis not present

## 2016-01-23 DIAGNOSIS — R293 Abnormal posture: Secondary | ICD-10-CM | POA: Diagnosis not present

## 2016-01-23 DIAGNOSIS — M2560 Stiffness of unspecified joint, not elsewhere classified: Secondary | ICD-10-CM | POA: Diagnosis not present

## 2016-01-24 DIAGNOSIS — M2560 Stiffness of unspecified joint, not elsewhere classified: Secondary | ICD-10-CM | POA: Diagnosis not present

## 2016-01-24 DIAGNOSIS — M5441 Lumbago with sciatica, right side: Secondary | ICD-10-CM | POA: Diagnosis not present

## 2016-01-24 DIAGNOSIS — R293 Abnormal posture: Secondary | ICD-10-CM | POA: Diagnosis not present

## 2016-01-24 DIAGNOSIS — R262 Difficulty in walking, not elsewhere classified: Secondary | ICD-10-CM | POA: Diagnosis not present

## 2016-01-27 DIAGNOSIS — M2560 Stiffness of unspecified joint, not elsewhere classified: Secondary | ICD-10-CM | POA: Diagnosis not present

## 2016-01-27 DIAGNOSIS — R293 Abnormal posture: Secondary | ICD-10-CM | POA: Diagnosis not present

## 2016-01-27 DIAGNOSIS — R262 Difficulty in walking, not elsewhere classified: Secondary | ICD-10-CM | POA: Diagnosis not present

## 2016-01-27 DIAGNOSIS — M5441 Lumbago with sciatica, right side: Secondary | ICD-10-CM | POA: Diagnosis not present

## 2016-01-29 DIAGNOSIS — R293 Abnormal posture: Secondary | ICD-10-CM | POA: Diagnosis not present

## 2016-01-29 DIAGNOSIS — M5441 Lumbago with sciatica, right side: Secondary | ICD-10-CM | POA: Diagnosis not present

## 2016-01-29 DIAGNOSIS — M2560 Stiffness of unspecified joint, not elsewhere classified: Secondary | ICD-10-CM | POA: Diagnosis not present

## 2016-01-29 DIAGNOSIS — R262 Difficulty in walking, not elsewhere classified: Secondary | ICD-10-CM | POA: Diagnosis not present

## 2016-01-31 DIAGNOSIS — R293 Abnormal posture: Secondary | ICD-10-CM | POA: Diagnosis not present

## 2016-01-31 DIAGNOSIS — R262 Difficulty in walking, not elsewhere classified: Secondary | ICD-10-CM | POA: Diagnosis not present

## 2016-01-31 DIAGNOSIS — M5441 Lumbago with sciatica, right side: Secondary | ICD-10-CM | POA: Diagnosis not present

## 2016-01-31 DIAGNOSIS — M2560 Stiffness of unspecified joint, not elsewhere classified: Secondary | ICD-10-CM | POA: Diagnosis not present

## 2016-02-03 DIAGNOSIS — R293 Abnormal posture: Secondary | ICD-10-CM | POA: Diagnosis not present

## 2016-02-03 DIAGNOSIS — R262 Difficulty in walking, not elsewhere classified: Secondary | ICD-10-CM | POA: Diagnosis not present

## 2016-02-03 DIAGNOSIS — M5441 Lumbago with sciatica, right side: Secondary | ICD-10-CM | POA: Diagnosis not present

## 2016-02-03 DIAGNOSIS — M2560 Stiffness of unspecified joint, not elsewhere classified: Secondary | ICD-10-CM | POA: Diagnosis not present

## 2016-02-05 DIAGNOSIS — M5441 Lumbago with sciatica, right side: Secondary | ICD-10-CM | POA: Diagnosis not present

## 2016-02-05 DIAGNOSIS — R262 Difficulty in walking, not elsewhere classified: Secondary | ICD-10-CM | POA: Diagnosis not present

## 2016-02-05 DIAGNOSIS — M2560 Stiffness of unspecified joint, not elsewhere classified: Secondary | ICD-10-CM | POA: Diagnosis not present

## 2016-02-05 DIAGNOSIS — R293 Abnormal posture: Secondary | ICD-10-CM | POA: Diagnosis not present

## 2016-02-07 DIAGNOSIS — R293 Abnormal posture: Secondary | ICD-10-CM | POA: Diagnosis not present

## 2016-02-07 DIAGNOSIS — M2560 Stiffness of unspecified joint, not elsewhere classified: Secondary | ICD-10-CM | POA: Diagnosis not present

## 2016-02-07 DIAGNOSIS — M5441 Lumbago with sciatica, right side: Secondary | ICD-10-CM | POA: Diagnosis not present

## 2016-02-07 DIAGNOSIS — R262 Difficulty in walking, not elsewhere classified: Secondary | ICD-10-CM | POA: Diagnosis not present

## 2016-02-10 DIAGNOSIS — R293 Abnormal posture: Secondary | ICD-10-CM | POA: Diagnosis not present

## 2016-02-10 DIAGNOSIS — M2560 Stiffness of unspecified joint, not elsewhere classified: Secondary | ICD-10-CM | POA: Diagnosis not present

## 2016-02-10 DIAGNOSIS — R262 Difficulty in walking, not elsewhere classified: Secondary | ICD-10-CM | POA: Diagnosis not present

## 2016-02-10 DIAGNOSIS — M5441 Lumbago with sciatica, right side: Secondary | ICD-10-CM | POA: Diagnosis not present

## 2016-02-12 DIAGNOSIS — M5441 Lumbago with sciatica, right side: Secondary | ICD-10-CM | POA: Diagnosis not present

## 2016-02-12 DIAGNOSIS — R262 Difficulty in walking, not elsewhere classified: Secondary | ICD-10-CM | POA: Diagnosis not present

## 2016-02-12 DIAGNOSIS — M2560 Stiffness of unspecified joint, not elsewhere classified: Secondary | ICD-10-CM | POA: Diagnosis not present

## 2016-02-12 DIAGNOSIS — R293 Abnormal posture: Secondary | ICD-10-CM | POA: Diagnosis not present

## 2016-02-14 DIAGNOSIS — R262 Difficulty in walking, not elsewhere classified: Secondary | ICD-10-CM | POA: Diagnosis not present

## 2016-02-14 DIAGNOSIS — M5441 Lumbago with sciatica, right side: Secondary | ICD-10-CM | POA: Diagnosis not present

## 2016-02-14 DIAGNOSIS — M2560 Stiffness of unspecified joint, not elsewhere classified: Secondary | ICD-10-CM | POA: Diagnosis not present

## 2016-02-14 DIAGNOSIS — R293 Abnormal posture: Secondary | ICD-10-CM | POA: Diagnosis not present

## 2016-02-18 DIAGNOSIS — M5441 Lumbago with sciatica, right side: Secondary | ICD-10-CM | POA: Diagnosis not present

## 2016-02-18 DIAGNOSIS — R262 Difficulty in walking, not elsewhere classified: Secondary | ICD-10-CM | POA: Diagnosis not present

## 2016-02-18 DIAGNOSIS — M2560 Stiffness of unspecified joint, not elsewhere classified: Secondary | ICD-10-CM | POA: Diagnosis not present

## 2016-02-18 DIAGNOSIS — R293 Abnormal posture: Secondary | ICD-10-CM | POA: Diagnosis not present

## 2016-04-01 DIAGNOSIS — Z6830 Body mass index (BMI) 30.0-30.9, adult: Secondary | ICD-10-CM | POA: Diagnosis not present

## 2016-04-01 DIAGNOSIS — J343 Hypertrophy of nasal turbinates: Secondary | ICD-10-CM | POA: Diagnosis not present

## 2016-04-01 DIAGNOSIS — R062 Wheezing: Secondary | ICD-10-CM | POA: Diagnosis not present

## 2016-04-01 DIAGNOSIS — R070 Pain in throat: Secondary | ICD-10-CM | POA: Diagnosis not present

## 2016-04-01 DIAGNOSIS — Z1389 Encounter for screening for other disorder: Secondary | ICD-10-CM | POA: Diagnosis not present

## 2016-04-01 DIAGNOSIS — J209 Acute bronchitis, unspecified: Secondary | ICD-10-CM | POA: Diagnosis not present

## 2016-04-01 DIAGNOSIS — I10 Essential (primary) hypertension: Secondary | ICD-10-CM | POA: Diagnosis not present

## 2016-04-01 DIAGNOSIS — J069 Acute upper respiratory infection, unspecified: Secondary | ICD-10-CM | POA: Diagnosis not present

## 2016-06-29 DIAGNOSIS — E782 Mixed hyperlipidemia: Secondary | ICD-10-CM | POA: Diagnosis not present

## 2016-06-29 DIAGNOSIS — E6609 Other obesity due to excess calories: Secondary | ICD-10-CM | POA: Diagnosis not present

## 2016-06-29 DIAGNOSIS — I10 Essential (primary) hypertension: Secondary | ICD-10-CM | POA: Diagnosis not present

## 2016-06-29 DIAGNOSIS — Z1389 Encounter for screening for other disorder: Secondary | ICD-10-CM | POA: Diagnosis not present

## 2016-06-29 DIAGNOSIS — Z23 Encounter for immunization: Secondary | ICD-10-CM | POA: Diagnosis not present

## 2016-06-29 DIAGNOSIS — E119 Type 2 diabetes mellitus without complications: Secondary | ICD-10-CM | POA: Diagnosis not present

## 2016-06-29 DIAGNOSIS — Z6830 Body mass index (BMI) 30.0-30.9, adult: Secondary | ICD-10-CM | POA: Diagnosis not present

## 2016-07-28 ENCOUNTER — Other Ambulatory Visit (HOSPITAL_COMMUNITY): Payer: Self-pay | Admitting: Internal Medicine

## 2016-07-28 DIAGNOSIS — M5416 Radiculopathy, lumbar region: Secondary | ICD-10-CM

## 2016-12-07 DIAGNOSIS — H43811 Vitreous degeneration, right eye: Secondary | ICD-10-CM | POA: Diagnosis not present

## 2016-12-07 DIAGNOSIS — H2589 Other age-related cataract: Secondary | ICD-10-CM | POA: Diagnosis not present

## 2016-12-07 DIAGNOSIS — H40013 Open angle with borderline findings, low risk, bilateral: Secondary | ICD-10-CM | POA: Diagnosis not present

## 2016-12-07 DIAGNOSIS — H25012 Cortical age-related cataract, left eye: Secondary | ICD-10-CM | POA: Diagnosis not present

## 2016-12-07 DIAGNOSIS — E119 Type 2 diabetes mellitus without complications: Secondary | ICD-10-CM | POA: Diagnosis not present

## 2017-01-06 DIAGNOSIS — E119 Type 2 diabetes mellitus without complications: Secondary | ICD-10-CM | POA: Diagnosis not present

## 2017-01-06 DIAGNOSIS — Z6831 Body mass index (BMI) 31.0-31.9, adult: Secondary | ICD-10-CM | POA: Diagnosis not present

## 2017-01-06 DIAGNOSIS — I10 Essential (primary) hypertension: Secondary | ICD-10-CM | POA: Diagnosis not present

## 2017-01-06 DIAGNOSIS — Z1389 Encounter for screening for other disorder: Secondary | ICD-10-CM | POA: Diagnosis not present

## 2017-01-07 DIAGNOSIS — E6609 Other obesity due to excess calories: Secondary | ICD-10-CM | POA: Diagnosis not present

## 2017-01-07 DIAGNOSIS — E782 Mixed hyperlipidemia: Secondary | ICD-10-CM | POA: Diagnosis not present

## 2017-01-07 DIAGNOSIS — Z6831 Body mass index (BMI) 31.0-31.9, adult: Secondary | ICD-10-CM | POA: Diagnosis not present

## 2017-01-07 DIAGNOSIS — Z125 Encounter for screening for malignant neoplasm of prostate: Secondary | ICD-10-CM | POA: Diagnosis not present

## 2017-01-07 DIAGNOSIS — Z1389 Encounter for screening for other disorder: Secondary | ICD-10-CM | POA: Diagnosis not present

## 2017-01-13 ENCOUNTER — Other Ambulatory Visit (HOSPITAL_COMMUNITY): Payer: Self-pay | Admitting: Internal Medicine

## 2017-01-13 DIAGNOSIS — I10 Essential (primary) hypertension: Secondary | ICD-10-CM

## 2017-01-19 ENCOUNTER — Ambulatory Visit (HOSPITAL_COMMUNITY)
Admission: RE | Admit: 2017-01-19 | Discharge: 2017-01-19 | Disposition: A | Discharge status: Home / Self Care | Payer: Medicare Other | Source: Ambulatory Visit | Attending: Internal Medicine | Admitting: Internal Medicine

## 2017-01-19 DIAGNOSIS — I6521 Occlusion and stenosis of right carotid artery: Secondary | ICD-10-CM | POA: Insufficient documentation

## 2017-01-19 DIAGNOSIS — I10 Essential (primary) hypertension: Secondary | ICD-10-CM

## 2017-01-19 DIAGNOSIS — I6523 Occlusion and stenosis of bilateral carotid arteries: Secondary | ICD-10-CM | POA: Diagnosis not present

## 2017-02-08 DIAGNOSIS — H40013 Open angle with borderline findings, low risk, bilateral: Secondary | ICD-10-CM | POA: Diagnosis not present

## 2017-02-08 DIAGNOSIS — H268 Other specified cataract: Secondary | ICD-10-CM | POA: Diagnosis not present

## 2017-04-28 DIAGNOSIS — E782 Mixed hyperlipidemia: Secondary | ICD-10-CM | POA: Diagnosis not present

## 2017-04-28 DIAGNOSIS — Z1389 Encounter for screening for other disorder: Secondary | ICD-10-CM | POA: Diagnosis not present

## 2017-04-28 DIAGNOSIS — Z6830 Body mass index (BMI) 30.0-30.9, adult: Secondary | ICD-10-CM | POA: Diagnosis not present

## 2017-04-28 DIAGNOSIS — M353 Polymyalgia rheumatica: Secondary | ICD-10-CM | POA: Diagnosis not present

## 2017-04-28 DIAGNOSIS — I10 Essential (primary) hypertension: Secondary | ICD-10-CM | POA: Diagnosis not present

## 2017-04-28 DIAGNOSIS — E119 Type 2 diabetes mellitus without complications: Secondary | ICD-10-CM | POA: Diagnosis not present

## 2017-04-29 DIAGNOSIS — R945 Abnormal results of liver function studies: Secondary | ICD-10-CM | POA: Diagnosis not present

## 2017-04-29 DIAGNOSIS — E6609 Other obesity due to excess calories: Secondary | ICD-10-CM | POA: Diagnosis not present

## 2017-04-29 DIAGNOSIS — Z6830 Body mass index (BMI) 30.0-30.9, adult: Secondary | ICD-10-CM | POA: Diagnosis not present

## 2017-04-29 DIAGNOSIS — Z1389 Encounter for screening for other disorder: Secondary | ICD-10-CM | POA: Diagnosis not present

## 2017-04-30 ENCOUNTER — Telehealth: Payer: Self-pay | Admitting: *Deleted

## 2017-04-30 NOTE — Telephone Encounter (Signed)
Referral from [employer], MD of [facility] sent to Scheduling

## 2017-05-04 ENCOUNTER — Telehealth: Payer: Self-pay | Admitting: Cardiology

## 2017-05-04 NOTE — Telephone Encounter (Signed)
Received records from [facility] for appointment on 05/17/17 with Dr Martinique.  Records put with Dr Doug Sou schedule for 05/17/17. lp

## 2017-05-15 NOTE — Progress Notes (Signed)
Cardiology Office Note   Date:  05/17/2017   ID:  Bruce, Abbott 1951/02/23, MRN [id number]  PCP:  Redmond School, MD  Cardiologist:   Estil Vallee Martinique, MD   Chief Complaint  Patient presents with  . New Patient (Initial Visit)    Pt states no Sx.   Marland Kitchen Hyperlipidemia      History of Present Illness: Bruce Abbott is a 66 y.o. male who is seen at the request of Dr. Gerarda Fraction for evaluation of hyperlipidemia. He reports a history of hypertriglyceridemia for the past 8 years. Most recent lab work showed triglyceride level of 550 which is reportedly higher than before. He has a history of DM type 2 and HTN. He has chronically been on fish oil and pravastatin. Pravastatin was stopped due to myalgias one month ago. Myalgias have improved. Was started on Lopid one month ago. Take only 500 mg of Krill oil daily. States he does a lot of chores but only does aerobic exercise 1-2 times a week. Diabetes has been under pretty good control with last A1c 7%. He tries to avoid fried foods, sweets and fast food. Does like eating potatoes. Weight has been stable around 220 lbs. No known vascular disease. Prior carotid dopplers and LE arterial dopplers were OK. No history of chest pain, dyspnea, palpitations.    Past Medical History:  Diagnosis Date  . Diabetes mellitus without complication ([place])   . Hyperlipemia   . Hypertension     Past Surgical History:  Procedure Laterality Date  . APPENDECTOMY    . CHOLECYSTECTOMY    . COLONOSCOPY  2005   Dr. Gala Romney: internal hemorrhoids, diminutive rectal polyp, path not available. Left-sided diverticula  . COLONOSCOPY N/A 06/12/2014   Procedure: COLONOSCOPY;  Surgeon: Daneil Dolin, MD;  [facility];  Service: Endoscopy;  Laterality: N/A;  8:45 AM rescheduled to 7:30 - Doris to notify pt  . GANGLION CYST EXCISION    . KNEE ARTHROSCOPY Left   . TONSILLECTOMY       Current Outpatient Prescriptions  Medication Sig Dispense Refill  .  allopurinol (ZYLOPRIM) 300 MG tablet Take 300 mg by mouth daily.    Marland Kitchen aspirin EC 81 MG tablet Take 81 mg by mouth daily.    . Coenzyme Q10 (CO Q 10) 100 MG CAPS Take 1 capsule by mouth daily.    Marland Kitchen gemfibrozil (LOPID) 600 MG tablet     . lisinopril-hydrochlorothiazide (PRINZIDE,ZESTORETIC) 10-12.5 MG per tablet Take 1 tablet by mouth daily.    . metFORMIN (GLUMETZA) 500 MG (MOD) 24 hr tablet Take 500 mg by mouth 2 (two) times daily with a meal.    . Omega-3 Fatty Acids (FISH OIL) 1200 MG CAPS Take 1 capsule by mouth daily.    . Omega-3 Krill Oil 500 MG CAPS Take 2 capsules by mouth daily.    . peg 3350 powder (MOVIPREP) 100 G SOLR Take 1 kit (200 g total) by mouth as directed. 1 kit 0  . pravastatin (PRAVACHOL) 20 MG tablet Take 20 mg by mouth daily.     No current facility-administered medications for this visit.     Allergies:   Patient has no known allergies.    Social History:  The patient  reports that he quit smoking about 10 years ago. He has never used smokeless tobacco. He reports that he drinks alcohol. He reports that he does not use drugs.   Family History:  The patient's family history includes Cancer  in his maternal grandmother; Heart disease in his mother; Hypertension in his mother; Leukemia in his father.    ROS:  Please see the history of present illness.   Otherwise, review of systems are positive for none.   All other systems are reviewed and negative.    PHYSICAL EXAM: VS:  BP 133/74   Pulse (!) 59   Ht 6' (1.829 m)   Wt 225 lb 3.2 oz (102.2 kg)   BMI 30.54 kg/m  , BMI Body mass index is 30.54 kg/m. GEN: Well nourished, overweight., in no acute distress  HEENT: normal  Neck: no JVD, carotid bruits, or masses Cardiac: RRR; no murmurs, rubs, or gallops,no edema  Respiratory:  clear to auscultation bilaterally, normal work of breathing GI: soft, nontender, nondistended, + BS MS: no deformity or atrophy  Skin: warm and dry, no rash Neuro:  Strength and  sensation are intact Psych: euthymic mood, full affect   EKG:  EKG is not ordered today. The ekg ordered today demonstrates N/A   Recent Labs: No results found for requested labs within last 8760 hours.    Lipid Panel No results found for: CHOL, TRIG, HDL, CHOLHDL, VLDL, LDLCALC, LDLDIRECT    Wt Readings from Last 3 Encounters:  05/17/17 225 lb 3.2 oz (102.2 kg)  05/03/14 227 lb (103 kg)    Labs dated 04/29/17: cholesterol 170, triglycerides 550, HDL 23, LDL unable to calculate. A1c 7%.  Dated 01/07/17: Normal CBC, CMET and TSH.  Other studies Reviewed: Additional studies/ records that were reviewed today include: none Review of the above records demonstrates: N/A   ASSESSMENT AND PLAN:  1.  Mixed hyperlipidemia with high triglycerides. This often reflects on his diabetes control but this currently looks pretty good. We discussed the major impact of lifestyle modification. Recommend avoiding fried foods and concentrated sweets especially food with high fructose corn syrup. Needs to eat less potatoes and avoid sodas. Recommend weight loss of 10 lbs. Increased aerobic activity to 30-45 minutes daily. I agree with addition of Lopid. With his diabetes I would resume a statin drug. Would consider starting with Crestor 5 mg daily. Increase fish oil to 2-4 grams daily. Repeat lab work in about a month with Dr. Gerarda Fraction. If triglycerides do not respond would consider switching Lopid to a fenofibrate. While medication will help I think he has the most to gain from lifestyle modification. I will see him back as needed.   Current medicines are reviewed at length with the patient today.  The patient does not have concerns regarding medicines.  The following changes have been made:  no change  Labs/ tests ordered today include: none No orders of the defined types were placed in this encounter.    Disposition:   FU with me PRN  Signed, Artis Beggs Martinique, MD  05/17/2017 9:42 AM    [facility] [address] [place], [place], [address] Phone [phone], Fax [phone]

## 2017-05-17 ENCOUNTER — Ambulatory Visit (INDEPENDENT_AMBULATORY_CARE_PROVIDER_SITE_OTHER): Payer: Medicare Other | Admitting: Cardiology

## 2017-05-17 ENCOUNTER — Encounter: Payer: Self-pay | Admitting: Cardiology

## 2017-05-17 VITALS — BP 133/74 | HR 59 | Ht 72.0 in | Wt 225.2 lb

## 2017-05-17 DIAGNOSIS — E782 Mixed hyperlipidemia: Secondary | ICD-10-CM

## 2017-05-17 DIAGNOSIS — I10 Essential (primary) hypertension: Secondary | ICD-10-CM | POA: Diagnosis not present

## 2017-05-17 NOTE — Patient Instructions (Addendum)
Work on weight loss, regular aerobic activity, and low fat/low sugar diet  Increase fish oil to 2000 mg daily  I would consider resuming a statin- low dose Crestor.  Repeat lab work in about a month.

## 2017-08-30 DIAGNOSIS — Z6829 Body mass index (BMI) 29.0-29.9, adult: Secondary | ICD-10-CM | POA: Diagnosis not present

## 2017-08-30 DIAGNOSIS — Z23 Encounter for immunization: Secondary | ICD-10-CM | POA: Diagnosis not present

## 2017-08-30 DIAGNOSIS — Z0000 Encounter for general adult medical examination without abnormal findings: Secondary | ICD-10-CM | POA: Diagnosis not present

## 2017-08-30 DIAGNOSIS — E119 Type 2 diabetes mellitus without complications: Secondary | ICD-10-CM | POA: Diagnosis not present

## 2017-08-30 DIAGNOSIS — E663 Overweight: Secondary | ICD-10-CM | POA: Diagnosis not present

## 2017-08-30 DIAGNOSIS — Z1389 Encounter for screening for other disorder: Secondary | ICD-10-CM | POA: Diagnosis not present

## 2017-09-02 DIAGNOSIS — Z0000 Encounter for general adult medical examination without abnormal findings: Secondary | ICD-10-CM | POA: Diagnosis not present

## 2017-09-02 DIAGNOSIS — Z23 Encounter for immunization: Secondary | ICD-10-CM | POA: Diagnosis not present

## 2017-09-02 DIAGNOSIS — Z1389 Encounter for screening for other disorder: Secondary | ICD-10-CM | POA: Diagnosis not present

## 2017-09-02 DIAGNOSIS — E782 Mixed hyperlipidemia: Secondary | ICD-10-CM | POA: Diagnosis not present

## 2017-12-02 DIAGNOSIS — R69 Illness, unspecified: Secondary | ICD-10-CM | POA: Diagnosis not present

## 2017-12-07 DIAGNOSIS — H5210 Myopia, unspecified eye: Secondary | ICD-10-CM | POA: Diagnosis not present

## 2017-12-07 DIAGNOSIS — H2589 Other age-related cataract: Secondary | ICD-10-CM | POA: Diagnosis not present

## 2017-12-07 DIAGNOSIS — Z0100 Encounter for examination of eyes and vision without abnormal findings: Secondary | ICD-10-CM | POA: Diagnosis not present

## 2017-12-07 DIAGNOSIS — H25012 Cortical age-related cataract, left eye: Secondary | ICD-10-CM | POA: Diagnosis not present

## 2017-12-13 DIAGNOSIS — R69 Illness, unspecified: Secondary | ICD-10-CM | POA: Diagnosis not present

## 2018-01-17 DIAGNOSIS — Z1283 Encounter for screening for malignant neoplasm of skin: Secondary | ICD-10-CM | POA: Diagnosis not present

## 2018-01-17 DIAGNOSIS — D225 Melanocytic nevi of trunk: Secondary | ICD-10-CM | POA: Diagnosis not present

## 2018-02-09 DIAGNOSIS — E119 Type 2 diabetes mellitus without complications: Secondary | ICD-10-CM | POA: Diagnosis not present

## 2018-02-09 DIAGNOSIS — Z1389 Encounter for screening for other disorder: Secondary | ICD-10-CM | POA: Diagnosis not present

## 2018-02-09 DIAGNOSIS — Z6829 Body mass index (BMI) 29.0-29.9, adult: Secondary | ICD-10-CM | POA: Diagnosis not present

## 2018-02-09 DIAGNOSIS — I10 Essential (primary) hypertension: Secondary | ICD-10-CM | POA: Diagnosis not present

## 2018-02-09 DIAGNOSIS — E663 Overweight: Secondary | ICD-10-CM | POA: Diagnosis not present

## 2018-03-20 DIAGNOSIS — J209 Acute bronchitis, unspecified: Secondary | ICD-10-CM | POA: Diagnosis not present

## 2018-03-21 DIAGNOSIS — E663 Overweight: Secondary | ICD-10-CM | POA: Diagnosis not present

## 2018-03-21 DIAGNOSIS — J329 Chronic sinusitis, unspecified: Secondary | ICD-10-CM | POA: Diagnosis not present

## 2018-03-21 DIAGNOSIS — E119 Type 2 diabetes mellitus without complications: Secondary | ICD-10-CM | POA: Diagnosis not present

## 2018-03-21 DIAGNOSIS — J042 Acute laryngotracheitis: Secondary | ICD-10-CM | POA: Diagnosis not present

## 2018-03-21 DIAGNOSIS — J9801 Acute bronchospasm: Secondary | ICD-10-CM | POA: Diagnosis not present

## 2018-03-21 DIAGNOSIS — R498 Other voice and resonance disorders: Secondary | ICD-10-CM | POA: Diagnosis not present

## 2018-03-21 DIAGNOSIS — Z6829 Body mass index (BMI) 29.0-29.9, adult: Secondary | ICD-10-CM | POA: Diagnosis not present

## 2018-03-21 DIAGNOSIS — I10 Essential (primary) hypertension: Secondary | ICD-10-CM | POA: Diagnosis not present

## 2018-03-23 ENCOUNTER — Other Ambulatory Visit (HOSPITAL_COMMUNITY): Payer: Self-pay | Admitting: Internal Medicine

## 2018-03-23 ENCOUNTER — Ambulatory Visit (HOSPITAL_COMMUNITY)
Admission: RE | Admit: 2018-03-23 | Discharge: 2018-03-23 | Disposition: A | Discharge status: Home / Self Care | Payer: Medicare HMO | Source: Ambulatory Visit | Attending: Internal Medicine | Admitting: Internal Medicine

## 2018-03-23 DIAGNOSIS — I10 Essential (primary) hypertension: Secondary | ICD-10-CM | POA: Diagnosis not present

## 2018-03-23 DIAGNOSIS — E663 Overweight: Secondary | ICD-10-CM | POA: Diagnosis not present

## 2018-03-23 DIAGNOSIS — R059 Cough, unspecified: Secondary | ICD-10-CM

## 2018-03-23 DIAGNOSIS — Z6828 Body mass index (BMI) 28.0-28.9, adult: Secondary | ICD-10-CM | POA: Diagnosis not present

## 2018-03-23 DIAGNOSIS — J9801 Acute bronchospasm: Secondary | ICD-10-CM | POA: Diagnosis not present

## 2018-03-23 DIAGNOSIS — E119 Type 2 diabetes mellitus without complications: Secondary | ICD-10-CM | POA: Diagnosis not present

## 2018-03-23 DIAGNOSIS — R05 Cough: Secondary | ICD-10-CM | POA: Diagnosis not present

## 2018-03-23 DIAGNOSIS — J189 Pneumonia, unspecified organism: Secondary | ICD-10-CM | POA: Diagnosis not present

## 2018-03-25 DIAGNOSIS — J189 Pneumonia, unspecified organism: Secondary | ICD-10-CM | POA: Diagnosis not present

## 2018-03-25 DIAGNOSIS — Z6828 Body mass index (BMI) 28.0-28.9, adult: Secondary | ICD-10-CM | POA: Diagnosis not present

## 2018-03-25 DIAGNOSIS — J9801 Acute bronchospasm: Secondary | ICD-10-CM | POA: Diagnosis not present

## 2018-03-25 DIAGNOSIS — E119 Type 2 diabetes mellitus without complications: Secondary | ICD-10-CM | POA: Diagnosis not present

## 2018-03-25 DIAGNOSIS — E663 Overweight: Secondary | ICD-10-CM | POA: Diagnosis not present

## 2018-04-28 IMAGING — US US CAROTID DUPLEX BILAT
1 series · 13 of 24 positions shown · non-contrast
Comparison: None.

CLINICAL DATA: Hypertension, hyperlipidemia and diabetes. Remote
smoker.

EXAM:
BILATERAL CAROTID DUPLEX ULTRASOUND
TECHNIQUE: Gray scale imaging, color Doppler and duplex ultrasound were
performed of bilateral carotid and vertebral arteries in the neck.

[Series 1: us carotid duplex bilat · 0.06mm/px · 13 of 69 slices shown]
[im 1/69]
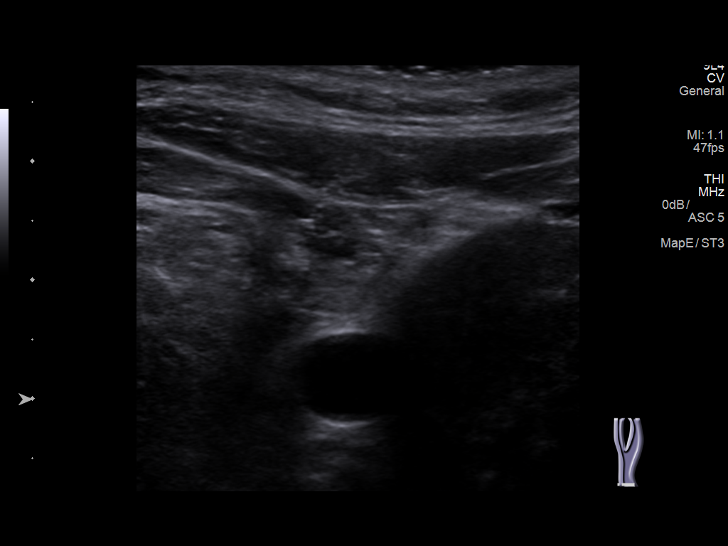
[im 6/69]
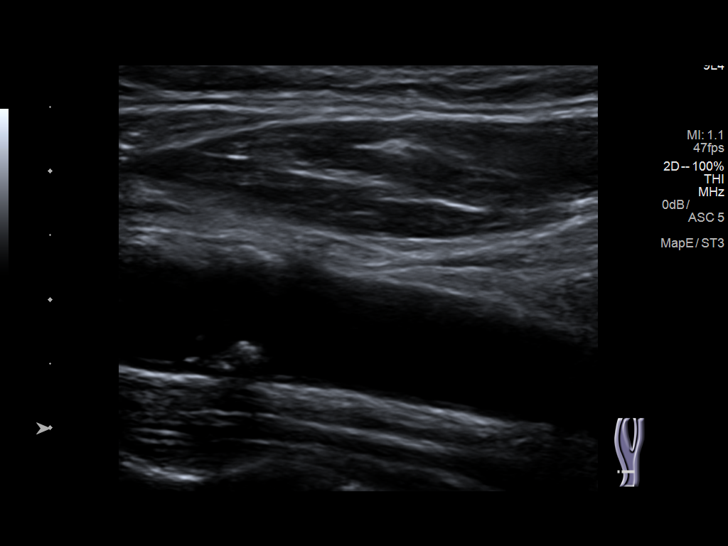
[im 12/69]
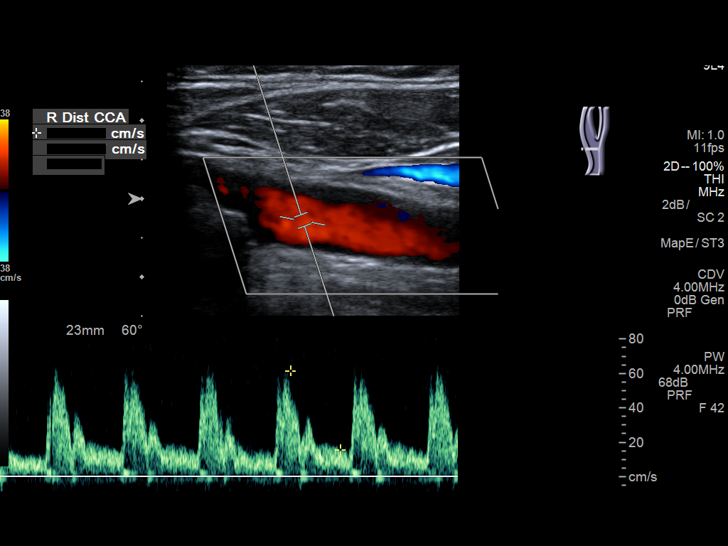
[im 18/69]
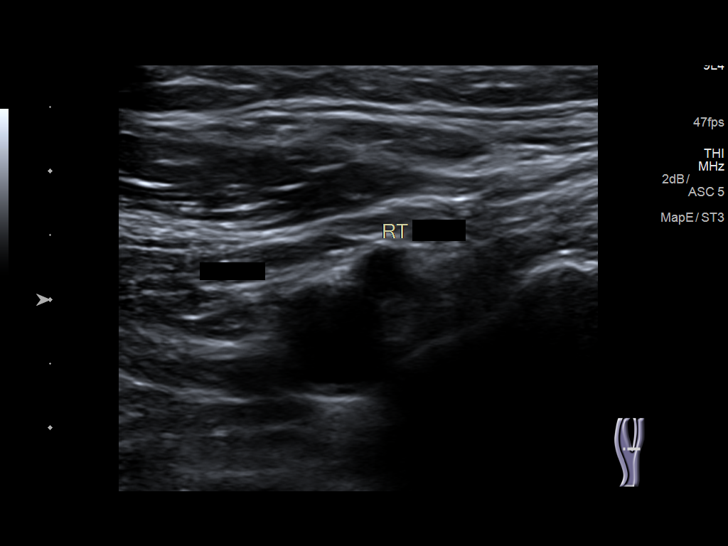
[im 24/69]
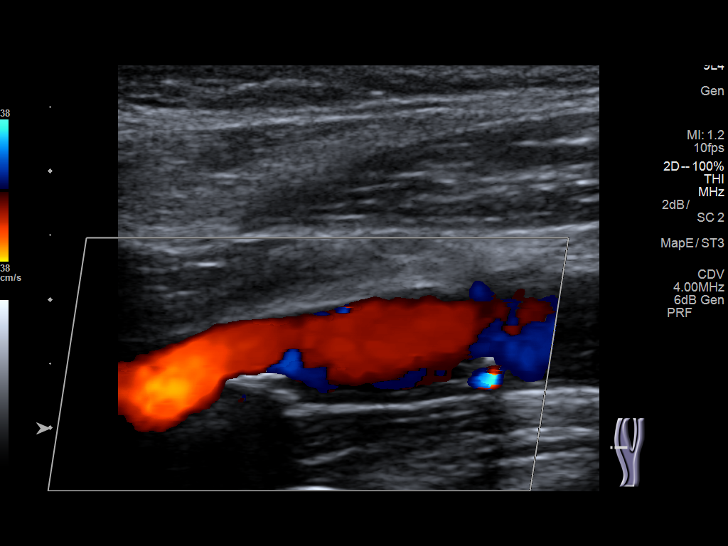
[im 30/69]
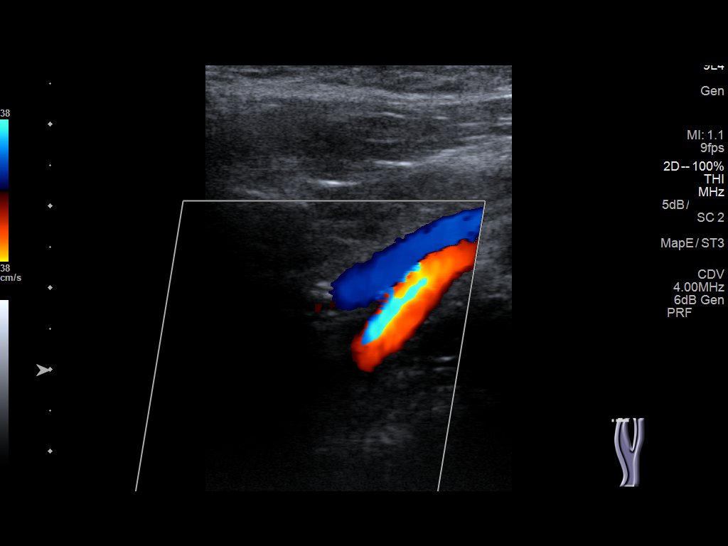
[im 36/69]
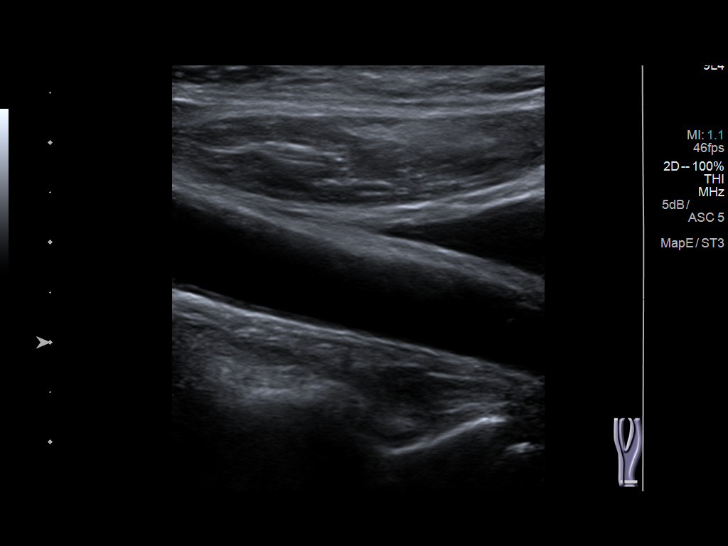
[im 39/69]
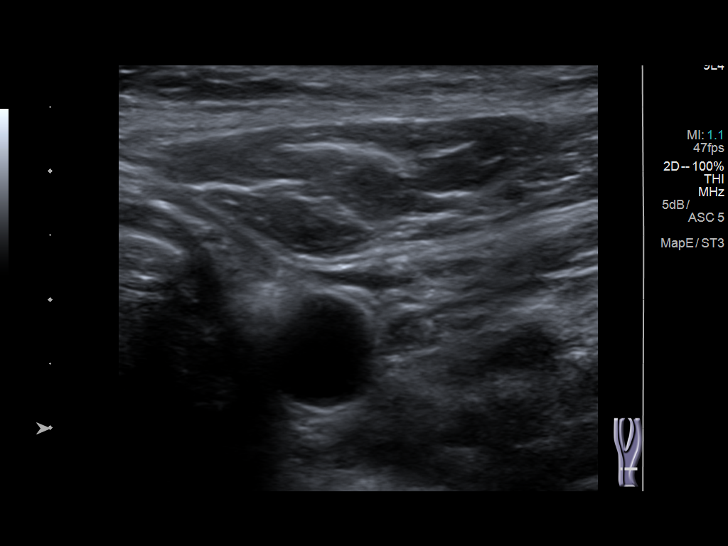
[im 45/69]
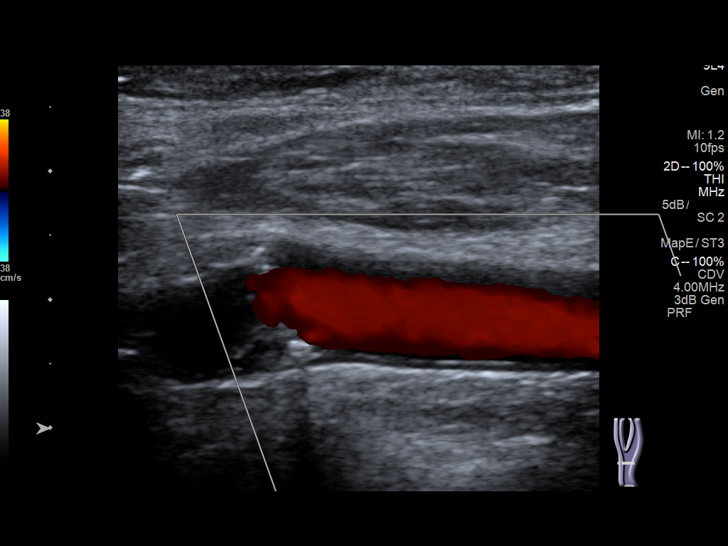
[im 51/69]
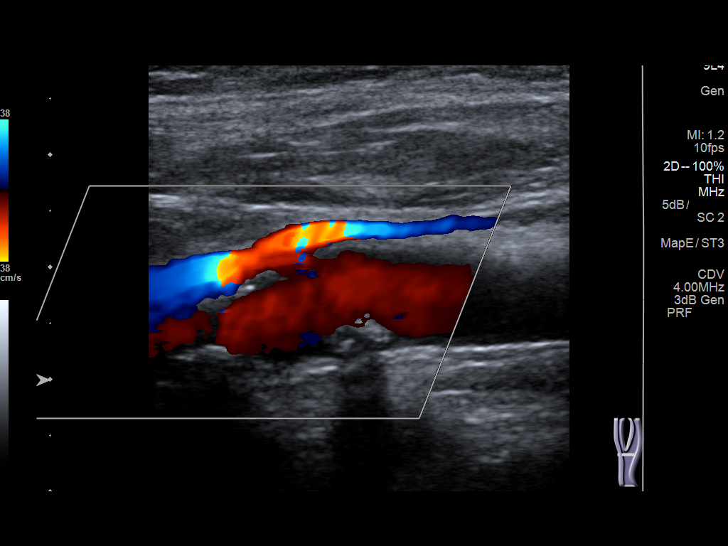
[im 57/69]
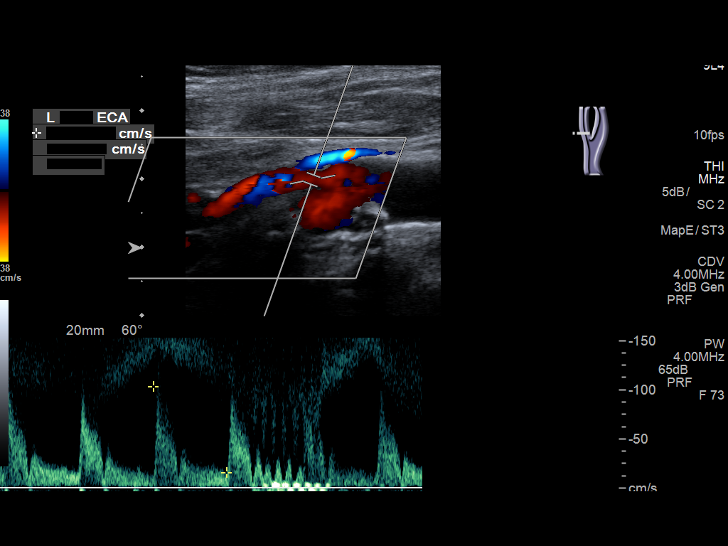
[im 63/69]
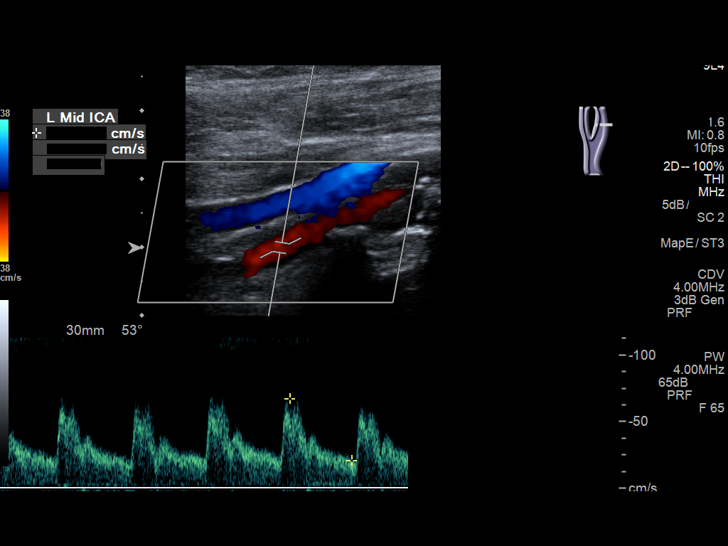
[im 69/69]
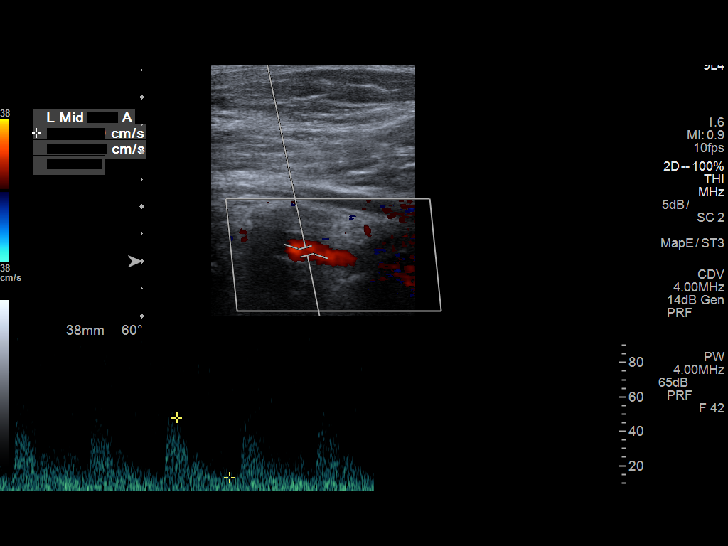

[13 of 24 positions shown; findings below may reference images not displayed]

FINDINGS: Criteria: Quantification of carotid stenosis is based on velocity
parameters that correlate the residual internal carotid diameter
with NASCET-based stenosis levels, using the diameter of the distal
internal carotid lumen as the denominator for stenosis measurement.

The following velocity measurements were obtained:

RIGHT

ICA:  72/21 cm/sec

CCA:  59/10 cm/sec

SYSTOLIC ICA/CCA RATIO:

DIASTOLIC ICA/CCA RATIO:

ECA:  101 cm/sec

LEFT

ICA:  76/27 cm/sec

CCA:  71/17 cm/sec

SYSTOLIC ICA/CCA RATIO:

DIASTOLIC ICA/CCA RATIO:

ECA:  103 cm/sec

RIGHT CAROTID ARTERY: Mild scattered echogenic shadowing plaque
formation. No hemodynamically significant right ICA stenosis,
velocity elevation, or turbulent flow. Degree of narrowing less than
50%.

RIGHT VERTEBRAL ARTERY:  Antegrade

LEFT CAROTID ARTERY: Moderate heterogeneous partially calcified left
carotid bifurcation atherosclerosis. Irregular plaque formation of
the left carotid bifurcation. Difficult to exclude ulcerated plaque
formation. Despite this, no hemodynamically significant left ICA
stenosis, velocity elevation, or turbulent flow. Degree of narrowing
less than 50%.

LEFT VERTEBRAL ARTERY:  Antegrade
IMPRESSION: Left greater than right carotid atherosclerosis.  See above comment.

No hemodynamically significant ICA stenosis. Degree of narrowing
less than 50% bilaterally.

Patent antegrade vertebral flow bilaterally.

## 2018-05-23 DIAGNOSIS — Z6828 Body mass index (BMI) 28.0-28.9, adult: Secondary | ICD-10-CM | POA: Diagnosis not present

## 2018-05-23 DIAGNOSIS — E1129 Type 2 diabetes mellitus with other diabetic kidney complication: Secondary | ICD-10-CM | POA: Diagnosis not present

## 2018-05-23 DIAGNOSIS — E782 Mixed hyperlipidemia: Secondary | ICD-10-CM | POA: Diagnosis not present

## 2018-05-23 DIAGNOSIS — N183 Chronic kidney disease, stage 3 (moderate): Secondary | ICD-10-CM | POA: Diagnosis not present

## 2018-05-23 DIAGNOSIS — E663 Overweight: Secondary | ICD-10-CM | POA: Diagnosis not present

## 2018-05-23 DIAGNOSIS — I10 Essential (primary) hypertension: Secondary | ICD-10-CM | POA: Diagnosis not present

## 2018-05-24 DIAGNOSIS — Z79899 Other long term (current) drug therapy: Secondary | ICD-10-CM | POA: Diagnosis not present

## 2018-05-24 DIAGNOSIS — Z125 Encounter for screening for malignant neoplasm of prostate: Secondary | ICD-10-CM | POA: Diagnosis not present

## 2018-05-24 DIAGNOSIS — E119 Type 2 diabetes mellitus without complications: Secondary | ICD-10-CM | POA: Diagnosis not present

## 2018-09-05 DIAGNOSIS — R69 Illness, unspecified: Secondary | ICD-10-CM | POA: Diagnosis not present

## 2018-09-05 DIAGNOSIS — E119 Type 2 diabetes mellitus without complications: Secondary | ICD-10-CM | POA: Diagnosis not present

## 2018-09-05 DIAGNOSIS — Z0001 Encounter for general adult medical examination with abnormal findings: Secondary | ICD-10-CM | POA: Diagnosis not present

## 2018-09-05 DIAGNOSIS — Z1389 Encounter for screening for other disorder: Secondary | ICD-10-CM | POA: Diagnosis not present

## 2018-09-05 DIAGNOSIS — E663 Overweight: Secondary | ICD-10-CM | POA: Diagnosis not present

## 2018-09-05 DIAGNOSIS — Z6829 Body mass index (BMI) 29.0-29.9, adult: Secondary | ICD-10-CM | POA: Diagnosis not present

## 2018-12-07 DIAGNOSIS — R69 Illness, unspecified: Secondary | ICD-10-CM | POA: Diagnosis not present

## 2018-12-08 DIAGNOSIS — H40012 Open angle with borderline findings, low risk, left eye: Secondary | ICD-10-CM | POA: Diagnosis not present

## 2018-12-08 DIAGNOSIS — H40002 Preglaucoma, unspecified, left eye: Secondary | ICD-10-CM | POA: Diagnosis not present

## 2018-12-08 DIAGNOSIS — H2589 Other age-related cataract: Secondary | ICD-10-CM | POA: Diagnosis not present

## 2018-12-08 DIAGNOSIS — H25012 Cortical age-related cataract, left eye: Secondary | ICD-10-CM | POA: Diagnosis not present

## 2018-12-29 DIAGNOSIS — R69 Illness, unspecified: Secondary | ICD-10-CM | POA: Diagnosis not present

## 2019-03-13 DIAGNOSIS — B356 Tinea cruris: Secondary | ICD-10-CM | POA: Diagnosis not present

## 2019-03-13 DIAGNOSIS — M1990 Unspecified osteoarthritis, unspecified site: Secondary | ICD-10-CM | POA: Diagnosis not present

## 2019-03-13 DIAGNOSIS — G64 Other disorders of peripheral nervous system: Secondary | ICD-10-CM | POA: Diagnosis not present

## 2019-03-13 DIAGNOSIS — E119 Type 2 diabetes mellitus without complications: Secondary | ICD-10-CM | POA: Diagnosis not present

## 2019-03-13 DIAGNOSIS — E663 Overweight: Secondary | ICD-10-CM | POA: Diagnosis not present

## 2019-03-13 DIAGNOSIS — Z1389 Encounter for screening for other disorder: Secondary | ICD-10-CM | POA: Diagnosis not present

## 2019-03-13 DIAGNOSIS — Z6828 Body mass index (BMI) 28.0-28.9, adult: Secondary | ICD-10-CM | POA: Diagnosis not present

## 2019-03-13 DIAGNOSIS — E1129 Type 2 diabetes mellitus with other diabetic kidney complication: Secondary | ICD-10-CM | POA: Diagnosis not present

## 2019-03-14 DIAGNOSIS — E782 Mixed hyperlipidemia: Secondary | ICD-10-CM | POA: Diagnosis not present

## 2019-03-14 DIAGNOSIS — Z125 Encounter for screening for malignant neoplasm of prostate: Secondary | ICD-10-CM | POA: Diagnosis not present

## 2019-03-14 DIAGNOSIS — E1165 Type 2 diabetes mellitus with hyperglycemia: Secondary | ICD-10-CM | POA: Diagnosis not present

## 2019-03-14 DIAGNOSIS — E1129 Type 2 diabetes mellitus with other diabetic kidney complication: Secondary | ICD-10-CM | POA: Diagnosis not present

## 2019-03-14 DIAGNOSIS — E7849 Other hyperlipidemia: Secondary | ICD-10-CM | POA: Diagnosis not present

## 2019-03-14 DIAGNOSIS — E119 Type 2 diabetes mellitus without complications: Secondary | ICD-10-CM | POA: Diagnosis not present

## 2019-03-14 DIAGNOSIS — I10 Essential (primary) hypertension: Secondary | ICD-10-CM | POA: Diagnosis not present

## 2019-03-14 DIAGNOSIS — Z6828 Body mass index (BMI) 28.0-28.9, adult: Secondary | ICD-10-CM | POA: Diagnosis not present

## 2019-03-14 DIAGNOSIS — E663 Overweight: Secondary | ICD-10-CM | POA: Diagnosis not present

## 2019-03-14 DIAGNOSIS — Z1389 Encounter for screening for other disorder: Secondary | ICD-10-CM | POA: Diagnosis not present

## 2019-03-17 ENCOUNTER — Other Ambulatory Visit: Payer: Self-pay

## 2019-03-17 ENCOUNTER — Ambulatory Visit: Payer: Medicare HMO | Admitting: Podiatry

## 2019-03-17 VITALS — BP 148/82 | HR 62 | Temp 98.1°F

## 2019-03-17 DIAGNOSIS — M2042 Other hammer toe(s) (acquired), left foot: Secondary | ICD-10-CM | POA: Diagnosis not present

## 2019-03-17 DIAGNOSIS — E1142 Type 2 diabetes mellitus with diabetic polyneuropathy: Secondary | ICD-10-CM | POA: Diagnosis not present

## 2019-03-17 DIAGNOSIS — E119 Type 2 diabetes mellitus without complications: Secondary | ICD-10-CM

## 2019-03-17 DIAGNOSIS — M21961 Unspecified acquired deformity of right lower leg: Secondary | ICD-10-CM | POA: Diagnosis not present

## 2019-03-17 DIAGNOSIS — M2041 Other hammer toe(s) (acquired), right foot: Secondary | ICD-10-CM | POA: Diagnosis not present

## 2019-03-17 DIAGNOSIS — M792 Neuralgia and neuritis, unspecified: Secondary | ICD-10-CM | POA: Diagnosis not present

## 2019-03-17 DIAGNOSIS — M21962 Unspecified acquired deformity of left lower leg: Secondary | ICD-10-CM

## 2019-03-17 MED ORDER — GABAPENTIN 300 MG PO CAPS: 300.0000 mg | ORAL_CAPSULE | Freq: Every day | ORAL | 0 refills | Status: DC

## 2019-03-17 NOTE — Progress Notes (Signed)
Subjective:  Patient ID: Bruce Abbott, male    DOB: [birth year],  MRN: [id number]  Chief Complaint  Patient presents with  . Diabetes    Diabetic foot exam  . Peripheral Neuropathy    tingling pain in feet, especially left    68 y.o. male presents  for diabetic foot care. Last A1C was 7.0. Requests Rx for DM shoes. Reports numbness and tingling in their feet. Denies cramping in legs and thighs.  Review of Systems: Negative except as noted in the HPI. Denies N/V/F/Ch.  Past Medical History:  Diagnosis Date  . Diabetes mellitus without complication ([place])   . Hyperlipemia   . Hypertension     Current Outpatient Medications:  .  allopurinol (ZYLOPRIM) 300 MG tablet, Take 300 mg by mouth daily., Disp: , Rfl:  .  aspirin EC 81 MG tablet, Take 81 mg by mouth daily., Disp: , Rfl:  .  clotrimazole-betamethasone (LOTRISONE) cream, APPLY TOPICALLY TO AFFECTED AND SURROUNDING AREAS OF SKIN TWICE DAILY IN THE MORNING AND THE EVENING, Disp: , Rfl:  .  Coenzyme Q10 (CO Q 10) 100 MG CAPS, Take 1 capsule by mouth daily., Disp: , Rfl:  .  gabapentin (NEURONTIN) 300 MG capsule, Take 1 capsule (300 mg total) by mouth at bedtime., Disp: 30 capsule, Rfl: 0 .  gemfibrozil (LOPID) 600 MG tablet, , Disp: , Rfl:  .  lisinopril-hydrochlorothiazide (PRINZIDE,ZESTORETIC) 10-12.5 MG per tablet, Take 1 tablet by mouth daily., Disp: , Rfl:  .  metFORMIN (GLUCOPHAGE) 500 MG tablet, TAKE 1 TABLET BY MOUTH TWICE DAILY FOR 90 DAYS, Disp: , Rfl:  .  metFORMIN (GLUMETZA) 500 MG (MOD) 24 hr tablet, Take 500 mg by mouth 2 (two) times daily with a meal., Disp: , Rfl:  .  Omega-3 Fatty Acids (FISH OIL) 1200 MG CAPS, Take 1 capsule by mouth daily., Disp: , Rfl:  .  Omega-3 Krill Oil 500 MG CAPS, Take 2 capsules by mouth daily., Disp: , Rfl:  .  peg 3350 powder (MOVIPREP) 100 G SOLR, Take 1 kit (200 g total) by mouth as directed., Disp: 1 kit, Rfl: 0 .  pravastatin (PRAVACHOL) 20 MG tablet, Take 20 mg by mouth daily.,  Disp: , Rfl:   Social History   Tobacco Use  Smoking Status Former Smoker  . Last attempt to quit: 05/18/2007  . Years since quitting: 11.8  Smokeless Tobacco Never Used  Tobacco Comment   Quit x 7-8 years    No Known Allergies Objective:   Vitals:   03/17/19 1014  BP: (!) 148/82  Pulse: 62  Temp: 98.1 F (36.7 C)   There is no height or weight on file to calculate BMI. Constitutional Well developed. Well nourished.  Vascular Dorsalis pedis pulses present 1+ bilaterally  Posterior tibial pulses present 1+ bilaterally  Pedal hair growth absent. Capillary refill normal to all digits.  No cyanosis or clubbing noted.  Neurologic Normal speech. Oriented to person, place, and time. Epicritic sensation to light touch grossly present bilaterally. Protective sensation with 5.07 monofilament  present bilaterally. Vibratory sensation present bilaterally.  Dermatologic Nails elongated, thickened, dystrophic. No open wounds. No skin lesions.  Orthopedic: Normal joint ROM without pain or crepitus bilaterally. Hammertoes bilat. Plantarflexed 2nd/3rd metatarsals No bony tenderness.   Assessment:   1. Encounter for diabetic foot exam ([place])   2. DM type 2 with diabetic peripheral neuropathy ([place])   3. Metatarsal deformity, left   4. Metatarsal deformity, right   5. Hammer toes of both feet  6. Neuralgia and neuritis    Plan:  Patient was evaluated and treated and all questions answered.  Diabetes with DPN, neuropathy -Trial Gabapentin. Discussed 300 mg qHS. Follow up with PCP for further titration -Would benefit from Diabetic shoes due to neuropathy and defomitties  No follow-ups on file.

## 2019-03-29 ENCOUNTER — Ambulatory Visit: Payer: Medicare HMO | Admitting: Orthopaedic Surgery

## 2019-03-30 ENCOUNTER — Ambulatory Visit (INDEPENDENT_AMBULATORY_CARE_PROVIDER_SITE_OTHER): Payer: Medicare HMO

## 2019-03-30 ENCOUNTER — Ambulatory Visit: Payer: Medicare HMO | Admitting: Orthopaedic Surgery

## 2019-03-30 ENCOUNTER — Encounter: Payer: Self-pay | Admitting: Orthopaedic Surgery

## 2019-03-30 ENCOUNTER — Other Ambulatory Visit: Payer: Self-pay

## 2019-03-30 VITALS — BP 124/79 | HR 61 | Temp 97.2°F | Ht 72.0 in | Wt 210.0 lb

## 2019-03-30 DIAGNOSIS — G8929 Other chronic pain: Secondary | ICD-10-CM | POA: Diagnosis not present

## 2019-03-30 DIAGNOSIS — M25561 Pain in right knee: Secondary | ICD-10-CM | POA: Diagnosis not present

## 2019-03-30 NOTE — Progress Notes (Signed)
Subjective:    Patient ID: Bruce Abbott, male    DOB: 10/03/1951, 68 y.o.   MRN: [id number]  HPI He has developed knee pain on the right for about a month to six weeks.  He has swelling and popping but no giving way or redness.  He has no trauma.  He saw Dr. Gerarda Fraction two weeks ago and had an injection into the knee then.  He is better but he still has swelling.  He has medial joint line pain.  He has no other joint problem.     Review of Systems  Constitutional: Positive for activity change.  Musculoskeletal: Positive for arthralgias, gait problem and joint swelling.  All other systems reviewed and are negative.  For Review of Systems, all other systems reviewed and are negative.  The following is a summary of the past history medically, past history surgically, known current medicines, social history and family history.  This information is gathered electronically by the computer from prior information and documentation.  I review this each visit and have found including this information at this point in the chart is beneficial and informative.   Past Medical History:  Diagnosis Date  . Diabetes mellitus without complication ([place])   . Hyperlipemia   . Hypertension     Past Surgical History:  Procedure Laterality Date  . APPENDECTOMY    . CHOLECYSTECTOMY    . COLONOSCOPY  2005   Dr. Gala Romney: internal hemorrhoids, diminutive rectal polyp, path not available. Left-sided diverticula  . COLONOSCOPY N/A 06/12/2014   Procedure: COLONOSCOPY;  Surgeon: Daneil Dolin, MD;  [facility];  Service: Endoscopy;  Laterality: N/A;  8:45 AM rescheduled to 7:30 - Doris to notify pt  . GANGLION CYST EXCISION    . KNEE ARTHROSCOPY Left   . TONSILLECTOMY      Current Outpatient Medications on File Prior to Visit  Medication Sig Dispense Refill  . allopurinol (ZYLOPRIM) 300 MG tablet Take 300 mg by mouth daily.    Marland Kitchen aspirin EC 81 MG tablet Take 81 mg by mouth daily.    .  clotrimazole-betamethasone (LOTRISONE) cream APPLY TOPICALLY TO AFFECTED AND SURROUNDING AREAS OF SKIN TWICE DAILY IN THE MORNING AND THE EVENING    . Coenzyme Q10 (CO Q 10) 100 MG CAPS Take 1 capsule by mouth daily.    Marland Kitchen gabapentin (NEURONTIN) 300 MG capsule Take 1 capsule (300 mg total) by mouth at bedtime. 30 capsule 0  . gemfibrozil (LOPID) 600 MG tablet     . lisinopril-hydrochlorothiazide (PRINZIDE,ZESTORETIC) 10-12.5 MG per tablet Take 1 tablet by mouth daily.    . metFORMIN (GLUCOPHAGE) 500 MG tablet TAKE 1 TABLET BY MOUTH TWICE DAILY FOR 90 DAYS    . metFORMIN (GLUMETZA) 500 MG (MOD) 24 hr tablet Take 500 mg by mouth 2 (two) times daily with a meal.    . Omega-3 Fatty Acids (FISH OIL) 1200 MG CAPS Take 1 capsule by mouth daily.    . Omega-3 Krill Oil 500 MG CAPS Take 2 capsules by mouth daily.    . peg 3350 powder (MOVIPREP) 100 G SOLR Take 1 kit (200 g total) by mouth as directed. 1 kit 0  . pravastatin (PRAVACHOL) 20 MG tablet Take 20 mg by mouth daily.     No current facility-administered medications on file prior to visit.     Social History   Socioeconomic History  . Marital status: Married    Spouse name: Not on file  . Number  of children: Not on file  . Years of education: Not on file  . Highest education level: Not on file  Occupational History  . Not on file  Social Needs  . Financial resource strain: Not on file  . Food insecurity:    Worry: Not on file    Inability: Not on file  . Transportation needs:    Medical: Not on file    Non-medical: Not on file  Tobacco Use  . Smoking status: Former Smoker    Last attempt to quit: 05/18/2007    Years since quitting: 11.8  . Smokeless tobacco: Never Used  . Tobacco comment: Quit x 7-8 years  Substance and Sexual Activity  . Alcohol use: Yes    Comment: occ  . Drug use: No  . Sexual activity: Not on file  Lifestyle  . Physical activity:    Days per week: Not on file    Minutes per session: Not on file  .  Stress: Not on file  Relationships  . Social connections:    Talks on phone: Not on file    Gets together: Not on file    Attends religious service: Not on file    Active member of club or organization: Not on file    Attends meetings of clubs or organizations: Not on file    Relationship status: Not on file  . Intimate partner violence:    Fear of current or ex partner: Not on file    Emotionally abused: Not on file    Physically abused: Not on file    Forced sexual activity: Not on file  Other Topics Concern  . Not on file  Social History Narrative  . Not on file    Family History  Problem Relation Age of Onset  . Hypertension Mother   . Heart disease Mother   . Leukemia Father   . Cancer Maternal Grandmother     BP 124/79   Pulse 61   Temp (!) 97.2 F (36.2 C)   Ht 6' (1.829 m)   Wt 210 lb (95.3 kg)   BMI 28.48 kg/m   Body mass index is 28.48 kg/m.     Objective:   Physical Exam Vitals signs reviewed.  Constitutional:      Appearance: He is well-developed.  HENT:     Head: Normocephalic and atraumatic.  Eyes:     Conjunctiva/sclera: Conjunctivae normal.     Pupils: Pupils are equal, round, and reactive to light.  Neck:     Musculoskeletal: Normal range of motion and neck supple.  Cardiovascular:     Rate and Rhythm: Normal rate and regular rhythm.  Pulmonary:     Effort: Pulmonary effort is normal.  Abdominal:     Palpations: Abdomen is soft.  Skin:    General: Skin is warm and dry.  Neurological:     Mental Status: He is alert and oriented to person, place, and time.     Cranial Nerves: No cranial nerve deficit.     Motor: No abnormal muscle tone.     Coordination: Coordination normal.     Deep Tendon Reflexes: Reflexes are normal and symmetric. Reflexes normal.  Psychiatric:        Behavior: Behavior normal.        Thought Content: Thought content normal.        Judgment: Judgment normal.   x-rays were done of the right knee today, reported  separately.  DJD medially.  Assessment & Plan:   Encounter Diagnosis  Name Primary?  . Chronic pain of right knee Yes   I am concerned about a possible medial meniscus tear.  He may need MRI of the knee if the pain gets worse or he gets giving way.  Return in three weeks.  Call if worse.  Electronically Signed Sanjuana Kava, MD 5/28/202012:00 PM

## 2019-04-10 ENCOUNTER — Other Ambulatory Visit: Payer: Self-pay

## 2019-04-10 ENCOUNTER — Ambulatory Visit: Payer: Medicare HMO | Admitting: Orthotics

## 2019-04-10 DIAGNOSIS — E119 Type 2 diabetes mellitus without complications: Secondary | ICD-10-CM

## 2019-04-10 DIAGNOSIS — M21962 Unspecified acquired deformity of left lower leg: Secondary | ICD-10-CM

## 2019-04-10 DIAGNOSIS — E1142 Type 2 diabetes mellitus with diabetic polyneuropathy: Secondary | ICD-10-CM

## 2019-04-10 NOTE — Progress Notes (Signed)
Patient presents today for diabetic shoe measurement and foam casting.  Goals of diabetic shoes/inserts to offer protection from conditions secondary to DM2, offer relief from sheer forces that could lead to ulcerations, protect the foot, and offer greater stability. Patient is under supervision of DPM Price Physician managing patients DM2: [place] Patient has following documented conditions to qualify for diabetic shoes/inserts: PN, HT b/l Patient measured with brannock device: 11.5 w  Patient was scanned.

## 2019-04-20 ENCOUNTER — Encounter: Payer: Self-pay | Admitting: Orthopaedic Surgery

## 2019-04-20 ENCOUNTER — Telehealth: Payer: Self-pay | Admitting: Radiology

## 2019-04-20 ENCOUNTER — Ambulatory Visit: Payer: Medicare HMO | Admitting: Orthopaedic Surgery

## 2019-04-20 ENCOUNTER — Other Ambulatory Visit: Payer: Self-pay

## 2019-04-20 VITALS — BP 137/82 | HR 57 | Temp 97.5°F | Ht 72.0 in | Wt 211.0 lb

## 2019-04-20 DIAGNOSIS — G8929 Other chronic pain: Secondary | ICD-10-CM

## 2019-04-20 DIAGNOSIS — M25561 Pain in right knee: Secondary | ICD-10-CM

## 2019-04-20 NOTE — Telephone Encounter (Signed)
Called [facility] June 25th 4pm arrive at 3:30

## 2019-04-20 NOTE — Progress Notes (Signed)
Patient BZ:JIRCVE Bruce Abbott, male DOB:03/06/1951, 68 y.o. LFY:[id number]  Chief Complaint  Patient presents with  . Knee Pain    Rt knee follow up    HPI  Bruce Abbott is a 68 y.o. male who has continued pain of the right knee.  He is only slightly better.  He has pain and feeling of giving way when he walks at times.  He is using a knee sleeve.  He has had injection and medication and still has problem.  I am concerned about meniscus tear.  I will get a MRI of the right knee.   Body mass index is 28.62 kg/m.  ROS  Review of Systems  Constitutional: Positive for activity change.  Musculoskeletal: Positive for arthralgias, gait problem and joint swelling.  All other systems reviewed and are negative.   All other systems reviewed and are negative.  The following is a summary of the past history medically, past history surgically, known current medicines, social history and family history.  This information is gathered electronically by the computer from prior information and documentation.  I review this each visit and have found including this information at this point in the chart is beneficial and informative.    Past Medical History:  Diagnosis Date  . Diabetes mellitus without complication ([place])   . Hyperlipemia   . Hypertension     Past Surgical History:  Procedure Laterality Date  . APPENDECTOMY    . CHOLECYSTECTOMY    . COLONOSCOPY  2005   Dr. Gala Romney: internal hemorrhoids, diminutive rectal polyp, path not available. Left-sided diverticula  . COLONOSCOPY N/A 06/12/2014   Procedure: COLONOSCOPY;  Surgeon: Daneil Dolin, MD;  [facility];  Service: Endoscopy;  Laterality: N/A;  8:45 AM rescheduled to 7:30 - Doris to notify pt  . GANGLION CYST EXCISION    . KNEE ARTHROSCOPY Left   . TONSILLECTOMY      Family History  Problem Relation Age of Onset  . Hypertension Mother   . Heart disease Mother   . Leukemia Father   . Cancer Maternal Grandmother      Social History Social History   Tobacco Use  . Smoking status: Former Smoker    Quit date: 05/18/2007    Years since quitting: 11.9  . Smokeless tobacco: Never Used  . Tobacco comment: Quit x 7-8 years  Substance Use Topics  . Alcohol use: Yes    Comment: occ  . Drug use: No    No Known Allergies  Current Outpatient Medications  Medication Sig Dispense Refill  . allopurinol (ZYLOPRIM) 300 MG tablet Take 300 mg by mouth daily.    Marland Kitchen aspirin EC 81 MG tablet Take 81 mg by mouth daily.    . clotrimazole-betamethasone (LOTRISONE) cream APPLY TOPICALLY TO AFFECTED AND SURROUNDING AREAS OF SKIN TWICE DAILY IN THE MORNING AND THE EVENING    . Coenzyme Q10 (CO Q 10) 100 MG CAPS Take 1 capsule by mouth daily.    Marland Kitchen gabapentin (NEURONTIN) 300 MG capsule Take 1 capsule (300 mg total) by mouth at bedtime. 30 capsule 0  . gemfibrozil (LOPID) 600 MG tablet     . lisinopril-hydrochlorothiazide (PRINZIDE,ZESTORETIC) 10-12.5 MG per tablet Take 1 tablet by mouth daily.    . metFORMIN (GLUCOPHAGE) 500 MG tablet TAKE 1 TABLET BY MOUTH TWICE DAILY FOR 90 DAYS    . metFORMIN (GLUMETZA) 500 MG (MOD) 24 hr tablet Take 500 mg by mouth 2 (two) times daily with a meal.    .  Omega-3 Fatty Acids (FISH OIL) 1200 MG CAPS Take 1 capsule by mouth daily.    . Omega-3 Krill Oil 500 MG CAPS Take 2 capsules by mouth daily.    . peg 3350 powder (MOVIPREP) 100 G SOLR Take 1 kit (200 g total) by mouth as directed. 1 kit 0  . pravastatin (PRAVACHOL) 20 MG tablet Take 20 mg by mouth daily.     No current facility-administered medications for this visit.      Physical Exam  Blood pressure 137/82, pulse (!) 57, temperature (!) 97.5 F (36.4 C), height 6' (1.829 m), weight 211 lb (95.7 kg).  Constitutional: overall normal hygiene, normal nutrition, well developed, normal grooming, normal body habitus. Assistive device:knee sleeve  Musculoskeletal: gait and station Limp right, muscle tone and strength are normal,  no tremors or atrophy is present.  .  Neurological: coordination overall normal.  Deep tendon reflex/nerve stretch intact.  Sensation normal.  Cranial nerves II-XII intact.   Skin:   Normal overall no scars, lesions, ulcers or rashes. No psoriasis.  Psychiatric: Alert and oriented x 3.  Recent memory intact, remote memory unclear.  Normal mood and affect. Well groomed.  Good eye contact.  Cardiovascular: overall no swelling, no varicosities, no edema bilaterally, normal temperatures of the legs and arms, no clubbing, cyanosis and good capillary refill.  Lymphatic: palpation is normal.  Right knee has effusion, ROM 0 to 110, crepitus, medial joint line pain, positive medial McMurray, limp right, NV intact.  All other systems reviewed and are negative   The patient has been educated about the nature of the problem(s) and counseled on treatment options.  The patient appeared to understand what I have discussed and is in agreement with it.  Encounter Diagnosis  Name Primary?  . Chronic pain of right knee Yes    PLAN Call if any problems.  Precautions discussed.  Continue current medications.   Return to clinic MRI right knee   Electronically Signed Sanjuana Kava, MD 6/18/20208:48 AM

## 2019-04-27 ENCOUNTER — Other Ambulatory Visit: Payer: Self-pay

## 2019-04-27 ENCOUNTER — Ambulatory Visit (HOSPITAL_COMMUNITY)
Admission: RE | Admit: 2019-04-27 | Discharge: 2019-04-27 | Disposition: A | Discharge status: Home / Self Care | Payer: Medicare HMO | Source: Ambulatory Visit | Attending: Orthopaedic Surgery | Admitting: Orthopaedic Surgery

## 2019-04-27 DIAGNOSIS — G8929 Other chronic pain: Secondary | ICD-10-CM | POA: Diagnosis not present

## 2019-04-27 DIAGNOSIS — M25561 Pain in right knee: Secondary | ICD-10-CM | POA: Diagnosis not present

## 2019-05-02 ENCOUNTER — Encounter: Payer: Self-pay | Admitting: Orthopaedic Surgery

## 2019-05-02 ENCOUNTER — Ambulatory Visit (INDEPENDENT_AMBULATORY_CARE_PROVIDER_SITE_OTHER): Payer: Medicare HMO | Admitting: Orthopaedic Surgery

## 2019-05-02 ENCOUNTER — Other Ambulatory Visit: Payer: Self-pay

## 2019-05-02 VITALS — BP 124/66 | HR 68 | Temp 97.9°F | Ht 72.0 in | Wt 211.0 lb

## 2019-05-02 DIAGNOSIS — G8929 Other chronic pain: Secondary | ICD-10-CM

## 2019-05-02 DIAGNOSIS — M25561 Pain in right knee: Secondary | ICD-10-CM

## 2019-05-02 NOTE — Progress Notes (Signed)
Patient Bruce Abbott, male DOB:October 21, 1951, 68 y.o. SWF:[id number]  Chief Complaint  Patient presents with  . Knee Pain    right   . Results    MRI review     HPI  Bruce Abbott is a 68 y.o. male who has right knee pain with swelling and giving way.  He had a MRI done which showed: IMPRESSION: 1. Complex tear of body and posterior horn of the medial meniscus with a radial component and peripheral meniscal extrusion. 2. Complex tear of the anterior horn of the lateral meniscus. Horizontal tear of the anterior horn-body junction of the lateral meniscus extending to the free edge. 3. Tricompartmental cartilage abnormalities as described above.  I have explained the findings to him.  I have recommended arthroscopy of the knee.  He prefers to wait for now.  He says he can put up with it for a while longer.  I have no problem with that.  I will see him as needed.   Body mass index is 28.62 kg/m.  ROS  Review of Systems  Constitutional: Positive for activity change.  Musculoskeletal: Positive for arthralgias, gait problem and joint swelling.  All other systems reviewed and are negative.   All other systems reviewed and are negative.  The following is a summary of the past history medically, past history surgically, known current medicines, social history and family history.  This information is gathered electronically by the computer from prior information and documentation.  I review this each visit and have found including this information at this point in the chart is beneficial and informative.    Past Medical History:  Diagnosis Date  . Diabetes mellitus without complication ([place])   . Hyperlipemia   . Hypertension     Past Surgical History:  Procedure Laterality Date  . APPENDECTOMY    . CHOLECYSTECTOMY    . COLONOSCOPY  2005   Dr. Gala Romney: internal hemorrhoids, diminutive rectal polyp, path not available. Left-sided diverticula  . COLONOSCOPY N/A 06/12/2014   Procedure: COLONOSCOPY;  Surgeon: Daneil Dolin, MD;  Location: [facility];  Service: Endoscopy;  Laterality: N/A;  8:45 AM rescheduled to 7:30 - Doris to notify pt  . GANGLION CYST EXCISION    . KNEE ARTHROSCOPY Left   . TONSILLECTOMY      Family History  Problem Relation Age of Onset  . Hypertension Mother   . Heart disease Mother   . Leukemia Father   . Cancer Maternal Grandmother     Social History Social History   Tobacco Use  . Smoking status: Former Smoker    Quit date: 05/18/2007    Years since quitting: 11.9  . Smokeless tobacco: Never Used  . Tobacco comment: Quit x 7-8 years  Substance Use Topics  . Alcohol use: Yes    Comment: occ  . Drug use: No    No Known Allergies  Current Outpatient Medications  Medication Sig Dispense Refill  . allopurinol (ZYLOPRIM) 300 MG tablet Take 300 mg by mouth daily.    Marland Kitchen aspirin EC 81 MG tablet Take 81 mg by mouth daily.    . clotrimazole-betamethasone (LOTRISONE) cream APPLY TOPICALLY TO AFFECTED AND SURROUNDING AREAS OF SKIN TWICE DAILY IN THE MORNING AND THE EVENING    . Coenzyme Q10 (CO Q 10) 100 MG CAPS Take 1 capsule by mouth daily.    Marland Kitchen gabapentin (NEURONTIN) 300 MG capsule Take 1 capsule (300 mg total) by mouth at bedtime. 30 capsule 0  . gemfibrozil (  LOPID) 600 MG tablet     . lisinopril-hydrochlorothiazide (PRINZIDE,ZESTORETIC) 10-12.5 MG per tablet Take 1 tablet by mouth daily.    . metFORMIN (GLUCOPHAGE) 500 MG tablet TAKE 1 TABLET BY MOUTH TWICE DAILY FOR 90 DAYS    . metFORMIN (GLUMETZA) 500 MG (MOD) 24 hr tablet Take 500 mg by mouth 2 (two) times daily with a meal.    . Omega-3 Fatty Acids (FISH OIL) 1200 MG CAPS Take 1 capsule by mouth daily.    . Omega-3 Krill Oil 500 MG CAPS Take 2 capsules by mouth daily.    . peg 3350 powder (MOVIPREP) 100 G SOLR Take 1 kit (200 g total) by mouth as directed. 1 kit 0  . pravastatin (PRAVACHOL) 20 MG tablet Take 20 mg by mouth daily.     No current facility-administered  medications for this visit.      Physical Exam  Blood pressure 124/66, pulse 68, temperature 97.9 F (36.6 C), height 6' (1.829 m), weight 211 lb (95.7 kg).  Constitutional: overall normal hygiene, normal nutrition, well developed, normal grooming, normal body habitus. Assistive device:none  Musculoskeletal: gait and station Limp none, muscle tone and strength are normal, no tremors or atrophy is present.  .  Neurological: coordination overall normal.  Deep tendon reflex/nerve stretch intact.  Sensation normal.  Cranial nerves II-XII intact.   Skin:   Normal overall no scars, lesions, ulcers or rashes. No psoriasis.  Psychiatric: Alert and oriented x 3.  Recent memory intact, remote memory unclear.  Normal mood and affect. Well groomed.  Good eye contact.  Cardiovascular: overall no swelling, no varicosities, no edema bilaterally, normal temperatures of the legs and arms, no clubbing, cyanosis and good capillary refill.  Lymphatic: palpation is normal.  Right knee is not painful today, he has only sight effusion, ROM 0 to 115,no limp, positive medial [place].  NV intact.  All other systems reviewed and are negative   The patient has been educated about the nature of the problem(s) and counseled on treatment options.  The patient appeared to understand what I have discussed and is in agreement with it.  Encounter Diagnosis  Name Primary?  . Chronic pain of right knee Yes    PLAN Call if any problems.  Precautions discussed.  Continue current medications.   Return to clinic as needed   Electronically Signed Sanjuana Kava, MD 6/30/20208:13 AM

## 2019-05-18 ENCOUNTER — Ambulatory Visit (INDEPENDENT_AMBULATORY_CARE_PROVIDER_SITE_OTHER): Payer: Medicare HMO | Admitting: Orthotics

## 2019-05-18 ENCOUNTER — Other Ambulatory Visit: Payer: Self-pay

## 2019-05-18 DIAGNOSIS — M21961 Unspecified acquired deformity of right lower leg: Secondary | ICD-10-CM

## 2019-05-18 DIAGNOSIS — M2041 Other hammer toe(s) (acquired), right foot: Secondary | ICD-10-CM

## 2019-05-18 DIAGNOSIS — M2042 Other hammer toe(s) (acquired), left foot: Secondary | ICD-10-CM | POA: Diagnosis not present

## 2019-05-18 DIAGNOSIS — M21962 Unspecified acquired deformity of left lower leg: Secondary | ICD-10-CM

## 2019-05-18 DIAGNOSIS — E1142 Type 2 diabetes mellitus with diabetic polyneuropathy: Secondary | ICD-10-CM

## 2019-05-18 DIAGNOSIS — E119 Type 2 diabetes mellitus without complications: Secondary | ICD-10-CM

## 2019-05-18 NOTE — Progress Notes (Signed)

## 2019-06-05 DIAGNOSIS — R69 Illness, unspecified: Secondary | ICD-10-CM | POA: Diagnosis not present

## 2019-07-28 DIAGNOSIS — R69 Illness, unspecified: Secondary | ICD-10-CM | POA: Diagnosis not present

## 2019-09-20 DIAGNOSIS — I10 Essential (primary) hypertension: Secondary | ICD-10-CM | POA: Diagnosis not present

## 2019-09-20 DIAGNOSIS — Z1389 Encounter for screening for other disorder: Secondary | ICD-10-CM | POA: Diagnosis not present

## 2019-09-20 DIAGNOSIS — M353 Polymyalgia rheumatica: Secondary | ICD-10-CM | POA: Diagnosis not present

## 2019-09-20 DIAGNOSIS — E1129 Type 2 diabetes mellitus with other diabetic kidney complication: Secondary | ICD-10-CM | POA: Diagnosis not present

## 2019-09-20 DIAGNOSIS — Z6828 Body mass index (BMI) 28.0-28.9, adult: Secondary | ICD-10-CM | POA: Diagnosis not present

## 2019-09-20 DIAGNOSIS — Z0001 Encounter for general adult medical examination with abnormal findings: Secondary | ICD-10-CM | POA: Diagnosis not present

## 2019-12-01 ENCOUNTER — Ambulatory Visit: Payer: Medicare HMO

## 2019-12-05 ENCOUNTER — Ambulatory Visit: Payer: Medicare HMO | Admitting: Orthotics

## 2019-12-05 ENCOUNTER — Other Ambulatory Visit: Payer: Self-pay

## 2019-12-05 DIAGNOSIS — E1142 Type 2 diabetes mellitus with diabetic polyneuropathy: Secondary | ICD-10-CM

## 2019-12-05 DIAGNOSIS — M21962 Unspecified acquired deformity of left lower leg: Secondary | ICD-10-CM

## 2019-12-05 DIAGNOSIS — M2042 Other hammer toe(s) (acquired), left foot: Secondary | ICD-10-CM

## 2019-12-05 DIAGNOSIS — M2041 Other hammer toe(s) (acquired), right foot: Secondary | ICD-10-CM

## 2019-12-05 NOTE — Progress Notes (Signed)

## 2019-12-11 DIAGNOSIS — E119 Type 2 diabetes mellitus without complications: Secondary | ICD-10-CM | POA: Diagnosis not present

## 2019-12-12 ENCOUNTER — Ambulatory Visit: Payer: Medicare HMO

## 2019-12-12 DIAGNOSIS — R69 Illness, unspecified: Secondary | ICD-10-CM | POA: Diagnosis not present

## 2020-01-26 DIAGNOSIS — H43391 Other vitreous opacities, right eye: Secondary | ICD-10-CM | POA: Diagnosis not present

## 2020-01-26 DIAGNOSIS — H43813 Vitreous degeneration, bilateral: Secondary | ICD-10-CM | POA: Diagnosis not present

## 2020-01-26 DIAGNOSIS — H33022 Retinal detachment with multiple breaks, left eye: Secondary | ICD-10-CM | POA: Diagnosis not present

## 2020-01-26 DIAGNOSIS — H35431 Paving stone degeneration of retina, right eye: Secondary | ICD-10-CM | POA: Diagnosis not present

## 2020-01-26 DIAGNOSIS — H33012 Retinal detachment with single break, left eye: Secondary | ICD-10-CM | POA: Diagnosis not present

## 2020-01-30 DIAGNOSIS — H33022 Retinal detachment with multiple breaks, left eye: Secondary | ICD-10-CM | POA: Diagnosis not present

## 2020-02-21 ENCOUNTER — Other Ambulatory Visit: Payer: Self-pay

## 2020-02-21 ENCOUNTER — Ambulatory Visit: Payer: Medicare HMO | Admitting: Orthotics

## 2020-03-01 DIAGNOSIS — M1990 Unspecified osteoarthritis, unspecified site: Secondary | ICD-10-CM | POA: Diagnosis not present

## 2020-03-01 DIAGNOSIS — E1122 Type 2 diabetes mellitus with diabetic chronic kidney disease: Secondary | ICD-10-CM | POA: Diagnosis not present

## 2020-03-01 DIAGNOSIS — H33022 Retinal detachment with multiple breaks, left eye: Secondary | ICD-10-CM | POA: Diagnosis not present

## 2020-03-01 DIAGNOSIS — N183 Chronic kidney disease, stage 3 unspecified: Secondary | ICD-10-CM | POA: Diagnosis not present

## 2020-03-01 DIAGNOSIS — I129 Hypertensive chronic kidney disease with stage 1 through stage 4 chronic kidney disease, or unspecified chronic kidney disease: Secondary | ICD-10-CM | POA: Diagnosis not present

## 2020-03-07 ENCOUNTER — Telehealth: Payer: Self-pay | Admitting: Podiatry

## 2020-03-07 NOTE — Telephone Encounter (Signed)
Pt left message for me to call back to schedule an appt to pick up the reordered diabetic shoes.   I returned call and left message for pt to call me back.

## 2020-03-07 NOTE — Telephone Encounter (Signed)
Pt scheduled to see Betha to puds on 5.14.2021.

## 2020-03-12 DIAGNOSIS — Z125 Encounter for screening for malignant neoplasm of prostate: Secondary | ICD-10-CM | POA: Diagnosis not present

## 2020-03-12 DIAGNOSIS — I10 Essential (primary) hypertension: Secondary | ICD-10-CM | POA: Diagnosis not present

## 2020-03-12 DIAGNOSIS — R69 Illness, unspecified: Secondary | ICD-10-CM | POA: Diagnosis not present

## 2020-03-12 DIAGNOSIS — E7849 Other hyperlipidemia: Secondary | ICD-10-CM | POA: Diagnosis not present

## 2020-03-12 DIAGNOSIS — E119 Type 2 diabetes mellitus without complications: Secondary | ICD-10-CM | POA: Diagnosis not present

## 2020-03-12 DIAGNOSIS — E663 Overweight: Secondary | ICD-10-CM | POA: Diagnosis not present

## 2020-03-12 DIAGNOSIS — Z6827 Body mass index (BMI) 27.0-27.9, adult: Secondary | ICD-10-CM | POA: Diagnosis not present

## 2020-03-12 DIAGNOSIS — E1129 Type 2 diabetes mellitus with other diabetic kidney complication: Secondary | ICD-10-CM | POA: Diagnosis not present

## 2020-03-12 DIAGNOSIS — Z23 Encounter for immunization: Secondary | ICD-10-CM | POA: Diagnosis not present

## 2020-03-12 DIAGNOSIS — E039 Hypothyroidism, unspecified: Secondary | ICD-10-CM | POA: Diagnosis not present

## 2020-03-15 ENCOUNTER — Other Ambulatory Visit: Payer: Self-pay

## 2020-03-15 ENCOUNTER — Ambulatory Visit: Payer: Medicare HMO | Admitting: Orthotics

## 2020-03-15 DIAGNOSIS — E1140 Type 2 diabetes mellitus with diabetic neuropathy, unspecified: Secondary | ICD-10-CM | POA: Diagnosis not present

## 2020-03-15 DIAGNOSIS — M2042 Other hammer toe(s) (acquired), left foot: Secondary | ICD-10-CM | POA: Diagnosis not present

## 2020-03-15 DIAGNOSIS — M216X1 Other acquired deformities of right foot: Secondary | ICD-10-CM | POA: Diagnosis not present

## 2020-03-15 DIAGNOSIS — M2041 Other hammer toe(s) (acquired), right foot: Secondary | ICD-10-CM | POA: Diagnosis not present

## 2020-04-01 DIAGNOSIS — E1122 Type 2 diabetes mellitus with diabetic chronic kidney disease: Secondary | ICD-10-CM | POA: Diagnosis not present

## 2020-04-01 DIAGNOSIS — I129 Hypertensive chronic kidney disease with stage 1 through stage 4 chronic kidney disease, or unspecified chronic kidney disease: Secondary | ICD-10-CM | POA: Diagnosis not present

## 2020-04-01 DIAGNOSIS — M1990 Unspecified osteoarthritis, unspecified site: Secondary | ICD-10-CM | POA: Diagnosis not present

## 2020-04-01 DIAGNOSIS — N183 Chronic kidney disease, stage 3 unspecified: Secondary | ICD-10-CM | POA: Diagnosis not present

## 2020-04-11 DIAGNOSIS — Z0100 Encounter for examination of eyes and vision without abnormal findings: Secondary | ICD-10-CM | POA: Diagnosis not present

## 2020-05-30 DIAGNOSIS — H43811 Vitreous degeneration, right eye: Secondary | ICD-10-CM | POA: Diagnosis not present

## 2020-05-30 DIAGNOSIS — H35431 Paving stone degeneration of retina, right eye: Secondary | ICD-10-CM | POA: Diagnosis not present

## 2020-05-30 DIAGNOSIS — H3581 Retinal edema: Secondary | ICD-10-CM | POA: Diagnosis not present

## 2020-05-30 DIAGNOSIS — E119 Type 2 diabetes mellitus without complications: Secondary | ICD-10-CM | POA: Diagnosis not present

## 2020-05-30 DIAGNOSIS — H31092 Other chorioretinal scars, left eye: Secondary | ICD-10-CM | POA: Diagnosis not present

## 2020-05-31 DIAGNOSIS — N1831 Chronic kidney disease, stage 3a: Secondary | ICD-10-CM | POA: Diagnosis not present

## 2020-05-31 DIAGNOSIS — M1990 Unspecified osteoarthritis, unspecified site: Secondary | ICD-10-CM | POA: Diagnosis not present

## 2020-05-31 DIAGNOSIS — I129 Hypertensive chronic kidney disease with stage 1 through stage 4 chronic kidney disease, or unspecified chronic kidney disease: Secondary | ICD-10-CM | POA: Diagnosis not present

## 2020-05-31 DIAGNOSIS — E1122 Type 2 diabetes mellitus with diabetic chronic kidney disease: Secondary | ICD-10-CM | POA: Diagnosis not present

## 2020-06-11 DIAGNOSIS — R69 Illness, unspecified: Secondary | ICD-10-CM | POA: Diagnosis not present

## 2020-07-18 ENCOUNTER — Ambulatory Visit: Payer: Medicare HMO | Attending: Internal Medicine

## 2020-07-18 DIAGNOSIS — Z23 Encounter for immunization: Secondary | ICD-10-CM

## 2020-07-18 NOTE — Progress Notes (Signed)
   Covid-19 Vaccination Clinic  Name:  JAQWON MANFRED    MRN: [id number] DOB: [birth year]  07/18/2020  Mr. Eskew was observed post Covid-19 immunization for 15 minutes without incident. He was provided with Vaccine Information Sheet and instruction to access the V-Safe system.   Mr. Sheils was instructed to call 911 with any severe reactions post vaccine: Marland Kitchen Difficulty breathing  . Swelling of face and throat  . A fast heartbeat  . A bad rash all over body  . Dizziness and weakness

## 2020-07-30 DIAGNOSIS — R69 Illness, unspecified: Secondary | ICD-10-CM | POA: Diagnosis not present

## 2020-08-01 DIAGNOSIS — E119 Type 2 diabetes mellitus without complications: Secondary | ICD-10-CM | POA: Diagnosis not present

## 2020-08-01 DIAGNOSIS — I10 Essential (primary) hypertension: Secondary | ICD-10-CM | POA: Diagnosis not present

## 2020-08-01 DIAGNOSIS — M109 Gout, unspecified: Secondary | ICD-10-CM | POA: Diagnosis not present

## 2020-08-01 DIAGNOSIS — E6609 Other obesity due to excess calories: Secondary | ICD-10-CM | POA: Diagnosis not present

## 2020-08-22 DIAGNOSIS — Z23 Encounter for immunization: Secondary | ICD-10-CM | POA: Diagnosis not present

## 2020-09-16 DIAGNOSIS — M1990 Unspecified osteoarthritis, unspecified site: Secondary | ICD-10-CM | POA: Diagnosis not present

## 2020-09-16 DIAGNOSIS — Z1331 Encounter for screening for depression: Secondary | ICD-10-CM | POA: Diagnosis not present

## 2020-09-16 DIAGNOSIS — Z0001 Encounter for general adult medical examination with abnormal findings: Secondary | ICD-10-CM | POA: Diagnosis not present

## 2020-09-16 DIAGNOSIS — Z1389 Encounter for screening for other disorder: Secondary | ICD-10-CM | POA: Diagnosis not present

## 2020-09-16 DIAGNOSIS — I10 Essential (primary) hypertension: Secondary | ICD-10-CM | POA: Diagnosis not present

## 2020-09-16 DIAGNOSIS — E1129 Type 2 diabetes mellitus with other diabetic kidney complication: Secondary | ICD-10-CM | POA: Diagnosis not present

## 2020-09-16 DIAGNOSIS — Z23 Encounter for immunization: Secondary | ICD-10-CM | POA: Diagnosis not present

## 2020-09-16 DIAGNOSIS — M109 Gout, unspecified: Secondary | ICD-10-CM | POA: Diagnosis not present

## 2020-09-16 DIAGNOSIS — Z6827 Body mass index (BMI) 27.0-27.9, adult: Secondary | ICD-10-CM | POA: Diagnosis not present

## 2020-11-14 DIAGNOSIS — H2513 Age-related nuclear cataract, bilateral: Secondary | ICD-10-CM | POA: Diagnosis not present

## 2020-11-14 DIAGNOSIS — H31092 Other chorioretinal scars, left eye: Secondary | ICD-10-CM | POA: Diagnosis not present

## 2020-11-14 DIAGNOSIS — H43813 Vitreous degeneration, bilateral: Secondary | ICD-10-CM | POA: Diagnosis not present

## 2020-11-14 DIAGNOSIS — H35431 Paving stone degeneration of retina, right eye: Secondary | ICD-10-CM | POA: Diagnosis not present

## 2020-11-30 DIAGNOSIS — M109 Gout, unspecified: Secondary | ICD-10-CM | POA: Diagnosis not present

## 2020-11-30 DIAGNOSIS — E119 Type 2 diabetes mellitus without complications: Secondary | ICD-10-CM | POA: Diagnosis not present

## 2020-11-30 DIAGNOSIS — E6609 Other obesity due to excess calories: Secondary | ICD-10-CM | POA: Diagnosis not present

## 2020-11-30 DIAGNOSIS — I10 Essential (primary) hypertension: Secondary | ICD-10-CM | POA: Diagnosis not present

## 2020-12-09 DIAGNOSIS — H5213 Myopia, bilateral: Secondary | ICD-10-CM | POA: Diagnosis not present

## 2020-12-09 DIAGNOSIS — H52223 Regular astigmatism, bilateral: Secondary | ICD-10-CM | POA: Diagnosis not present

## 2020-12-31 ENCOUNTER — Ambulatory Visit (INDEPENDENT_AMBULATORY_CARE_PROVIDER_SITE_OTHER): Payer: Medicare HMO | Admitting: Podiatry

## 2020-12-31 ENCOUNTER — Other Ambulatory Visit: Payer: Self-pay

## 2020-12-31 ENCOUNTER — Ambulatory Visit: Payer: Medicare HMO

## 2020-12-31 ENCOUNTER — Ambulatory Visit: Payer: Medicare HMO | Admitting: Podiatry

## 2020-12-31 DIAGNOSIS — E1142 Type 2 diabetes mellitus with diabetic polyneuropathy: Secondary | ICD-10-CM

## 2020-12-31 DIAGNOSIS — B351 Tinea unguium: Secondary | ICD-10-CM | POA: Diagnosis not present

## 2020-12-31 DIAGNOSIS — E1169 Type 2 diabetes mellitus with other specified complication: Secondary | ICD-10-CM

## 2020-12-31 NOTE — Progress Notes (Signed)
  Subjective:  Patient ID: Bruce Abbott, male    DOB: [birth year],  MRN: [id number]  Chief Complaint  Patient presents with  . nail trim     Fbs- 148 A1C 6.8 PCP-F[place] , [place] x Nov 2021   70 y.o. male presents with the above complaint. History confirmed with patient. Has painful neuropathy, which is helped by the gabapentin.  Objective:  Physical Exam: warm, good capillary refill, nail exam onychomycosis of the toenails, no trophic changes or ulcerative lesions. DP pulses palpable, PT pulses palpable and protective sensation absent Left Foot: hammertoes noted  Right Foot: hammetoes noted   No images are attached to the encounter.  Assessment:   1. Onychomycosis of multiple toenails with type 2 diabetes mellitus and peripheral neuropathy ([place])    Plan:  Patient was evaluated and treated and all questions answered.  Onychomycosis -Patient is diabetic with a qualifying condition for at risk foot care. -Would benefit from DM shoes. Will make appt for fabrication  Procedure: Nail Debridement Type of Debridement: manual, sharp debridement. Instrumentation: Nail nipper, rotary burr. Number of Nails: 10  No follow-ups on file.

## 2020-12-31 NOTE — Progress Notes (Signed)
Patient was measured for diabetic shoes. Patient signed the financial responsibility form. Will call patient whenever shoes arrive.

## 2021-01-27 ENCOUNTER — Telehealth: Payer: Self-pay | Admitting: Podiatry

## 2021-01-27 NOTE — Telephone Encounter (Signed)
Pt left message returning a call to schedule an appt to pick up diabetic shoes.  Returned call and left message for pt to call to schedule an appt.

## 2021-01-29 DIAGNOSIS — M109 Gout, unspecified: Secondary | ICD-10-CM | POA: Diagnosis not present

## 2021-01-29 DIAGNOSIS — E119 Type 2 diabetes mellitus without complications: Secondary | ICD-10-CM | POA: Diagnosis not present

## 2021-01-29 DIAGNOSIS — E6609 Other obesity due to excess calories: Secondary | ICD-10-CM | POA: Diagnosis not present

## 2021-01-29 DIAGNOSIS — I10 Essential (primary) hypertension: Secondary | ICD-10-CM | POA: Diagnosis not present

## 2021-02-04 ENCOUNTER — Ambulatory Visit (INDEPENDENT_AMBULATORY_CARE_PROVIDER_SITE_OTHER): Payer: Medicare HMO | Admitting: Podiatry

## 2021-02-04 ENCOUNTER — Other Ambulatory Visit: Payer: Self-pay

## 2021-02-04 DIAGNOSIS — M792 Neuralgia and neuritis, unspecified: Secondary | ICD-10-CM

## 2021-02-04 DIAGNOSIS — E1142 Type 2 diabetes mellitus with diabetic polyneuropathy: Secondary | ICD-10-CM | POA: Diagnosis not present

## 2021-02-04 DIAGNOSIS — M2042 Other hammer toe(s) (acquired), left foot: Secondary | ICD-10-CM | POA: Diagnosis not present

## 2021-02-04 DIAGNOSIS — M2041 Other hammer toe(s) (acquired), right foot: Secondary | ICD-10-CM | POA: Diagnosis not present

## 2021-02-04 NOTE — Progress Notes (Signed)
The patient presented to the office to pick up diabetic shoes and 3 pair diabetic custom inserts.  1 pair of inserts were put in the shoes and the shoes were fitted to the patient. The patient states they are comfortable and free of defect. He was satisfied with the fit of the shoe. Instructions for break in and wear were dispensed.   If any concerns or questions arise, he is instructed to call.

## 2021-03-01 DIAGNOSIS — E119 Type 2 diabetes mellitus without complications: Secondary | ICD-10-CM | POA: Diagnosis not present

## 2021-03-01 DIAGNOSIS — I10 Essential (primary) hypertension: Secondary | ICD-10-CM | POA: Diagnosis not present

## 2021-03-17 DIAGNOSIS — Z6828 Body mass index (BMI) 28.0-28.9, adult: Secondary | ICD-10-CM | POA: Diagnosis not present

## 2021-03-17 DIAGNOSIS — E663 Overweight: Secondary | ICD-10-CM | POA: Diagnosis not present

## 2021-03-17 DIAGNOSIS — I10 Essential (primary) hypertension: Secondary | ICD-10-CM | POA: Diagnosis not present

## 2021-03-17 DIAGNOSIS — E1129 Type 2 diabetes mellitus with other diabetic kidney complication: Secondary | ICD-10-CM | POA: Diagnosis not present

## 2021-03-17 DIAGNOSIS — E038 Other specified hypothyroidism: Secondary | ICD-10-CM | POA: Diagnosis not present

## 2021-03-17 DIAGNOSIS — N1831 Chronic kidney disease, stage 3a: Secondary | ICD-10-CM | POA: Diagnosis not present

## 2021-03-17 DIAGNOSIS — Z125 Encounter for screening for malignant neoplasm of prostate: Secondary | ICD-10-CM | POA: Diagnosis not present

## 2021-03-17 DIAGNOSIS — E1165 Type 2 diabetes mellitus with hyperglycemia: Secondary | ICD-10-CM | POA: Diagnosis not present

## 2021-03-17 DIAGNOSIS — Z1331 Encounter for screening for depression: Secondary | ICD-10-CM | POA: Diagnosis not present

## 2021-03-17 DIAGNOSIS — E7849 Other hyperlipidemia: Secondary | ICD-10-CM | POA: Diagnosis not present

## 2021-03-17 DIAGNOSIS — Z1389 Encounter for screening for other disorder: Secondary | ICD-10-CM | POA: Diagnosis not present

## 2021-05-01 DIAGNOSIS — E119 Type 2 diabetes mellitus without complications: Secondary | ICD-10-CM | POA: Diagnosis not present

## 2021-05-01 DIAGNOSIS — I10 Essential (primary) hypertension: Secondary | ICD-10-CM | POA: Diagnosis not present

## 2021-08-12 DIAGNOSIS — Z23 Encounter for immunization: Secondary | ICD-10-CM | POA: Diagnosis not present

## 2021-09-22 DIAGNOSIS — E6609 Other obesity due to excess calories: Secondary | ICD-10-CM | POA: Diagnosis not present

## 2021-09-22 DIAGNOSIS — I10 Essential (primary) hypertension: Secondary | ICD-10-CM | POA: Diagnosis not present

## 2021-09-22 DIAGNOSIS — E1122 Type 2 diabetes mellitus with diabetic chronic kidney disease: Secondary | ICD-10-CM | POA: Diagnosis not present

## 2021-09-22 DIAGNOSIS — Z6828 Body mass index (BMI) 28.0-28.9, adult: Secondary | ICD-10-CM | POA: Diagnosis not present

## 2021-11-20 DIAGNOSIS — H31092 Other chorioretinal scars, left eye: Secondary | ICD-10-CM | POA: Diagnosis not present

## 2021-11-20 DIAGNOSIS — H43813 Vitreous degeneration, bilateral: Secondary | ICD-10-CM | POA: Diagnosis not present

## 2021-11-20 DIAGNOSIS — H2513 Age-related nuclear cataract, bilateral: Secondary | ICD-10-CM | POA: Diagnosis not present

## 2021-11-20 DIAGNOSIS — H35431 Paving stone degeneration of retina, right eye: Secondary | ICD-10-CM | POA: Diagnosis not present

## 2022-01-13 DIAGNOSIS — H25813 Combined forms of age-related cataract, bilateral: Secondary | ICD-10-CM | POA: Diagnosis not present

## 2022-02-02 DIAGNOSIS — E1122 Type 2 diabetes mellitus with diabetic chronic kidney disease: Secondary | ICD-10-CM | POA: Diagnosis not present

## 2022-02-02 DIAGNOSIS — I10 Essential (primary) hypertension: Secondary | ICD-10-CM | POA: Diagnosis not present

## 2022-02-02 DIAGNOSIS — J9801 Acute bronchospasm: Secondary | ICD-10-CM | POA: Diagnosis not present

## 2022-02-02 DIAGNOSIS — Z6827 Body mass index (BMI) 27.0-27.9, adult: Secondary | ICD-10-CM | POA: Diagnosis not present

## 2022-02-02 DIAGNOSIS — E663 Overweight: Secondary | ICD-10-CM | POA: Diagnosis not present

## 2022-02-02 DIAGNOSIS — J209 Acute bronchitis, unspecified: Secondary | ICD-10-CM | POA: Diagnosis not present

## 2022-02-09 DIAGNOSIS — J22 Unspecified acute lower respiratory infection: Secondary | ICD-10-CM | POA: Diagnosis not present

## 2022-02-09 DIAGNOSIS — Z6827 Body mass index (BMI) 27.0-27.9, adult: Secondary | ICD-10-CM | POA: Diagnosis not present

## 2022-02-09 DIAGNOSIS — E663 Overweight: Secondary | ICD-10-CM | POA: Diagnosis not present

## 2022-03-18 DIAGNOSIS — E663 Overweight: Secondary | ICD-10-CM | POA: Diagnosis not present

## 2022-03-18 DIAGNOSIS — E1122 Type 2 diabetes mellitus with diabetic chronic kidney disease: Secondary | ICD-10-CM | POA: Diagnosis not present

## 2022-03-18 DIAGNOSIS — Z0001 Encounter for general adult medical examination with abnormal findings: Secondary | ICD-10-CM | POA: Diagnosis not present

## 2022-03-18 DIAGNOSIS — Z125 Encounter for screening for malignant neoplasm of prostate: Secondary | ICD-10-CM | POA: Diagnosis not present

## 2022-03-18 DIAGNOSIS — Z6827 Body mass index (BMI) 27.0-27.9, adult: Secondary | ICD-10-CM | POA: Diagnosis not present

## 2022-03-18 DIAGNOSIS — E119 Type 2 diabetes mellitus without complications: Secondary | ICD-10-CM | POA: Diagnosis not present

## 2022-03-18 DIAGNOSIS — I10 Essential (primary) hypertension: Secondary | ICD-10-CM | POA: Diagnosis not present

## 2022-03-18 DIAGNOSIS — E559 Vitamin D deficiency, unspecified: Secondary | ICD-10-CM | POA: Diagnosis not present

## 2022-03-31 DIAGNOSIS — E538 Deficiency of other specified B group vitamins: Secondary | ICD-10-CM | POA: Diagnosis not present

## 2022-04-30 DIAGNOSIS — E538 Deficiency of other specified B group vitamins: Secondary | ICD-10-CM | POA: Diagnosis not present

## 2022-05-06 ENCOUNTER — Ambulatory Visit (HOSPITAL_COMMUNITY)
Admission: RE | Admit: 2022-05-06 | Discharge: 2022-05-06 | Disposition: A | Discharge status: Home / Self Care | Payer: Medicare HMO | Source: Ambulatory Visit | Attending: Internal Medicine | Admitting: Internal Medicine

## 2022-05-06 ENCOUNTER — Other Ambulatory Visit (HOSPITAL_COMMUNITY): Payer: Self-pay | Admitting: Internal Medicine

## 2022-05-06 DIAGNOSIS — S99929A Unspecified injury of unspecified foot, initial encounter: Secondary | ICD-10-CM | POA: Diagnosis not present

## 2022-05-06 DIAGNOSIS — S92422A Displaced fracture of distal phalanx of left great toe, initial encounter for closed fracture: Secondary | ICD-10-CM | POA: Diagnosis not present

## 2022-05-06 DIAGNOSIS — S99929S Unspecified injury of unspecified foot, sequela: Secondary | ICD-10-CM

## 2022-05-06 DIAGNOSIS — Z6827 Body mass index (BMI) 27.0-27.9, adult: Secondary | ICD-10-CM | POA: Diagnosis not present

## 2022-05-06 DIAGNOSIS — E538 Deficiency of other specified B group vitamins: Secondary | ICD-10-CM | POA: Diagnosis not present

## 2022-05-06 DIAGNOSIS — E1122 Type 2 diabetes mellitus with diabetic chronic kidney disease: Secondary | ICD-10-CM | POA: Diagnosis not present

## 2022-05-06 DIAGNOSIS — E663 Overweight: Secondary | ICD-10-CM | POA: Diagnosis not present

## 2022-06-02 DIAGNOSIS — E538 Deficiency of other specified B group vitamins: Secondary | ICD-10-CM | POA: Diagnosis not present

## 2022-07-03 DIAGNOSIS — E538 Deficiency of other specified B group vitamins: Secondary | ICD-10-CM | POA: Diagnosis not present

## 2022-07-20 DIAGNOSIS — H01001 Unspecified blepharitis right upper eyelid: Secondary | ICD-10-CM | POA: Diagnosis not present

## 2022-07-20 DIAGNOSIS — E119 Type 2 diabetes mellitus without complications: Secondary | ICD-10-CM | POA: Diagnosis not present

## 2022-07-20 DIAGNOSIS — H401424 Capsular glaucoma with pseudoexfoliation of lens, left eye, indeterminate stage: Secondary | ICD-10-CM | POA: Diagnosis not present

## 2022-07-20 DIAGNOSIS — H2513 Age-related nuclear cataract, bilateral: Secondary | ICD-10-CM | POA: Diagnosis not present

## 2022-07-31 DIAGNOSIS — Z23 Encounter for immunization: Secondary | ICD-10-CM | POA: Diagnosis not present

## 2022-07-31 DIAGNOSIS — E538 Deficiency of other specified B group vitamins: Secondary | ICD-10-CM | POA: Diagnosis not present

## 2022-08-03 DIAGNOSIS — H01001 Unspecified blepharitis right upper eyelid: Secondary | ICD-10-CM | POA: Diagnosis not present

## 2022-08-03 DIAGNOSIS — H2513 Age-related nuclear cataract, bilateral: Secondary | ICD-10-CM | POA: Diagnosis not present

## 2022-08-03 DIAGNOSIS — E119 Type 2 diabetes mellitus without complications: Secondary | ICD-10-CM | POA: Diagnosis not present

## 2022-08-03 DIAGNOSIS — H401424 Capsular glaucoma with pseudoexfoliation of lens, left eye, indeterminate stage: Secondary | ICD-10-CM | POA: Diagnosis not present

## 2022-08-26 NOTE — H&P (Signed)
Surgical History & Physical  Patient Name: Bruce Abbott DOB: [birth year]  Surgery: Cataract extraction with intraocular lens implant phacoemulsification; Left Eye  Surgeon: Baruch Goldmann MD Surgery Date:  09-14-22 Pre-Op Date:  08-17-22  HPI: A 70 Yr. old male patient present for cataract evaluation per Dr. Jorja Loa. 1. 1. The patient complains of difficulty when driving due to glare from headlights or sun, which began 2 years ago. Both eyes are affected. The episode is gradual. The patient describes glare symptoms affecting their eyes/vision. This is negatively affecting the patient's quality of life and the patient is unable to function adequately in life with the current level of vision. HPI was performed by Baruch Goldmann .  Medical History: Retinal Detachment Cataracts Diabetes - DM Type 2, NIDDM High Blood Pressure LDL Neuropathy  Review of Systems Negative Allergic/Immunologic Negative Cardiovascular Negative Constitutional Negative Ear, Nose, Mouth & Throat Negative Endocrine Negative Eyes Negative Gastrointestinal Negative Genitourinary Negative Hemotologic/Lymphatic Negative Integumentary Negative Musculoskeletal Negative Neurological Negative Psychiatry Negative Respiratory  Social   Former smoker / Cigarettes   Medication Systane Complete, [place],  Allopurinol, Metformin, Rosuvastatin, Lisinopril-HCTZ, CoQ10, Omega 3 Supplement, Aspirin, Gemfibrozil, Gabapentin, Indomethacin,   Sx/Procedures Retinal Detachment Repair (Scleral Buckle),  Gallbladder Sx, Ganglion cysts- wrist, Tonsilectomy, Appendectomy,   Drug Allergies   NKDA  History & Physical: Heent: Cataract, left eye NECK: supple without bruits LUNGS: lungs clear to auscultation CV: regular rate and rhythm Abdomen: soft and non-tender Impression & Plan: Assessment: 1.  NUCLEAR SCLEROSIS AGE RELATED; Both Eyes (H25.13) 2.  GLAUCOMA PSEUDOEXFOLIATION; Left Eye Indeterminate (H40.1424) 3.   Diabetes Type 2 No retinopathy (E11.9) 4.  BLEPHARITIS; Right Upper Lid, Right Lower Lid, Left Upper Lid, Left Lower Lid (H01.001, H01.002,H01.004,H01.005) 5.  Pinguecula; Both Eyes (H11.153) 6.  CONJUNCTIVOCHALASIS; Both Eyes (H11.823) 7.  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  ASTIGMATISM, REGULAR; Both Eyes (H52.223)  Plan: 1.  Cataract accounts for the patient's decreased vision. This visual impairment is not correctable with a tolerable change in glasses or contact lenses. Cataract surgery with an implantation of a new lens should significantly improve the visual and functional status of the patient. Discussed all risks, benefits, alternatives, and potential complications. Discussed the procedures and recovery. Patient desires to have surgery. A-scan ordered and performed today for intra-ocular lens calculations. The surgery will be performed in order to improve vision for driving, reading, and for eye examinations. Recommend phacoemulsification with intra-ocular lens. Recommend Dextenza for post-operative pain and inflammation. Left Eye worse - first. Discussed pseudoexfoliation and increased risk. Dilates poorly - shugarcaine by protocol. Malyugin Ring. Omidira. Toric Lens.  2.  Elevated IOP OCT rNFL shows thinning. Start  3.  Stressed importance of blood sugar and blood pressure control, and also yearly eye examinations. Discussed the need for ongoing proactive ocular exams and treatment, hopefully before visual symptoms develop.  4.  Recommend regular lid cleaning.  5.  Observe; Artificial tears as needed for irritation.  6.  Observe; Artificial tears as needed for irritation.  7.  Mild monitor.  8.  Recommend toric IOL.

## 2022-09-04 DIAGNOSIS — E538 Deficiency of other specified B group vitamins: Secondary | ICD-10-CM | POA: Diagnosis not present

## 2022-09-07 DIAGNOSIS — H25812 Combined forms of age-related cataract, left eye: Secondary | ICD-10-CM | POA: Diagnosis not present

## 2022-09-09 ENCOUNTER — Encounter (HOSPITAL_COMMUNITY)
Admission: RE | Admit: 2022-09-09 | Discharge: 2022-09-09 | Disposition: A | Discharge status: Home / Self Care | Payer: Medicare HMO | Source: Ambulatory Visit | Attending: Ophthalmology | Admitting: Ophthalmology

## 2022-09-10 DIAGNOSIS — E119 Type 2 diabetes mellitus without complications: Secondary | ICD-10-CM | POA: Diagnosis not present

## 2022-09-10 DIAGNOSIS — H2513 Age-related nuclear cataract, bilateral: Secondary | ICD-10-CM | POA: Diagnosis not present

## 2022-09-10 DIAGNOSIS — H01001 Unspecified blepharitis right upper eyelid: Secondary | ICD-10-CM | POA: Diagnosis not present

## 2022-09-10 DIAGNOSIS — H401424 Capsular glaucoma with pseudoexfoliation of lens, left eye, indeterminate stage: Secondary | ICD-10-CM | POA: Diagnosis not present

## 2022-09-14 ENCOUNTER — Ambulatory Visit (HOSPITAL_COMMUNITY): Payer: Medicare HMO | Admitting: Certified Registered Nurse Anesthetist

## 2022-09-14 ENCOUNTER — Ambulatory Visit (HOSPITAL_BASED_OUTPATIENT_CLINIC_OR_DEPARTMENT_OTHER): Payer: Medicare HMO | Admitting: Certified Registered Nurse Anesthetist

## 2022-09-14 ENCOUNTER — Encounter (HOSPITAL_COMMUNITY)
Admission: RE | Disposition: A | Discharge status: Home / Self Care | Payer: Self-pay | Source: Ambulatory Visit | Attending: Ophthalmology

## 2022-09-14 ENCOUNTER — Ambulatory Visit (HOSPITAL_COMMUNITY)
Admission: RE | Admit: 2022-09-14 | Discharge: 2022-09-14 | Disposition: A | Discharge status: Home / Self Care | Payer: Medicare HMO | Source: Ambulatory Visit | Attending: Ophthalmology | Admitting: Ophthalmology

## 2022-09-14 ENCOUNTER — Encounter (HOSPITAL_COMMUNITY): Payer: Self-pay | Admitting: Ophthalmology

## 2022-09-14 ENCOUNTER — Other Ambulatory Visit: Payer: Self-pay

## 2022-09-14 DIAGNOSIS — H11153 Pinguecula, bilateral: Secondary | ICD-10-CM | POA: Diagnosis not present

## 2022-09-14 DIAGNOSIS — H0100A Unspecified blepharitis right eye, upper and lower eyelids: Secondary | ICD-10-CM | POA: Insufficient documentation

## 2022-09-14 DIAGNOSIS — E1139 Type 2 diabetes mellitus with other diabetic ophthalmic complication: Secondary | ICD-10-CM | POA: Diagnosis not present

## 2022-09-14 DIAGNOSIS — H18593 Other hereditary corneal dystrophies, bilateral: Secondary | ICD-10-CM | POA: Diagnosis not present

## 2022-09-14 DIAGNOSIS — H401424 Capsular glaucoma with pseudoexfoliation of lens, left eye, indeterminate stage: Secondary | ICD-10-CM | POA: Insufficient documentation

## 2022-09-14 DIAGNOSIS — H52223 Regular astigmatism, bilateral: Secondary | ICD-10-CM | POA: Diagnosis not present

## 2022-09-14 DIAGNOSIS — H11823 Conjunctivochalasis, bilateral: Secondary | ICD-10-CM | POA: Diagnosis not present

## 2022-09-14 DIAGNOSIS — H2512 Age-related nuclear cataract, left eye: Secondary | ICD-10-CM | POA: Insufficient documentation

## 2022-09-14 DIAGNOSIS — H259 Unspecified age-related cataract: Secondary | ICD-10-CM | POA: Diagnosis not present

## 2022-09-14 DIAGNOSIS — E1136 Type 2 diabetes mellitus with diabetic cataract: Secondary | ICD-10-CM | POA: Diagnosis not present

## 2022-09-14 DIAGNOSIS — H0100B Unspecified blepharitis left eye, upper and lower eyelids: Secondary | ICD-10-CM | POA: Diagnosis not present

## 2022-09-14 DIAGNOSIS — I10 Essential (primary) hypertension: Secondary | ICD-10-CM | POA: Diagnosis not present

## 2022-09-14 DIAGNOSIS — H2181 Floppy iris syndrome: Secondary | ICD-10-CM

## 2022-09-14 DIAGNOSIS — H25812 Combined forms of age-related cataract, left eye: Secondary | ICD-10-CM | POA: Diagnosis not present

## 2022-09-14 DIAGNOSIS — Z87891 Personal history of nicotine dependence: Secondary | ICD-10-CM | POA: Diagnosis not present

## 2022-09-14 DIAGNOSIS — H42 Glaucoma in diseases classified elsewhere: Secondary | ICD-10-CM | POA: Insufficient documentation

## 2022-09-14 HISTORY — PX: CATARACT EXTRACTION W/PHACO: SHX586

## 2022-09-14 LAB — GLUCOSE, CAPILLARY: Glucose-Capillary: 152 mg/dL — ABNORMAL HIGH (ref 70–99)

## 2022-09-14 SURGERY — PHACOEMULSIFICATION, CATARACT, WITH IOL INSERTION
Anesthesia: Monitor Anesthesia Care | Site: Eye | Laterality: Left

## 2022-09-14 MED ORDER — PHENYLEPHRINE-KETOROLAC 1-0.3 % IO SOLN
INTRAOCULAR | Status: AC
  Filled 2022-09-14: qty 4

## 2022-09-14 MED ORDER — PHENYLEPHRINE-KETOROLAC 1-0.3 % IO SOLN
INTRAOCULAR | Status: DC | PRN
  Administered 2022-09-14: 500 mL via OPHTHALMIC

## 2022-09-14 MED ORDER — MOXIFLOXACIN HCL 0.5 % OP SOLN
OPHTHALMIC | Status: DC | PRN
  Administered 2022-09-14: .2 mL via OPHTHALMIC

## 2022-09-14 MED ORDER — TROPICAMIDE 1 % OP SOLN
1.0000 [drp] | OPHTHALMIC | Status: AC | PRN
  Administered 2022-09-14 (×3): 1 [drp] via OPHTHALMIC

## 2022-09-14 MED ORDER — STERILE WATER FOR IRRIGATION IR SOLN
Status: DC | PRN
  Administered 2022-09-14: 250 mL

## 2022-09-14 MED ORDER — EPINEPHRINE PF 1 MG/ML IJ SOLN: INTRAOCULAR | Status: DC | PRN

## 2022-09-14 MED ORDER — BSS IO SOLN
INTRAOCULAR | Status: DC | PRN
  Administered 2022-09-14: 15 mL via INTRAOCULAR

## 2022-09-14 MED ORDER — MOXIFLOXACIN HCL 5 MG/ML IO SOLN
INTRAOCULAR | Status: AC
  Filled 2022-09-14: qty 1

## 2022-09-14 MED ORDER — PHENYLEPHRINE HCL 2.5 % OP SOLN
1.0000 [drp] | OPHTHALMIC | Status: AC | PRN
  Administered 2022-09-14 (×3): 1 [drp] via OPHTHALMIC

## 2022-09-14 MED ORDER — MIDAZOLAM HCL 5 MG/5ML IJ SOLN
INTRAMUSCULAR | Status: DC | PRN
  Administered 2022-09-14: .5 mg via INTRAVENOUS
  Administered 2022-09-14: 1 mg via INTRAVENOUS

## 2022-09-14 MED ORDER — EPINEPHRINE PF 1 MG/ML IJ SOLN
INTRAMUSCULAR | Status: AC
  Filled 2022-09-14: qty 1

## 2022-09-14 MED ORDER — LIDOCAINE HCL 3.5 % OP GEL
1.0000 | Freq: Once | OPHTHALMIC | Status: AC
  Administered 2022-09-14: 1 via OPHTHALMIC

## 2022-09-14 MED ORDER — SODIUM HYALURONATE 23MG/ML IO SOSY
PREFILLED_SYRINGE | INTRAOCULAR | Status: DC | PRN
  Administered 2022-09-14: .6 mL via INTRAOCULAR

## 2022-09-14 MED ORDER — LIDOCAINE HCL (PF) 1 % IJ SOLN
INTRAOCULAR | Status: DC | PRN
  Administered 2022-09-14: 1 mL via OPHTHALMIC

## 2022-09-14 MED ORDER — TETRACAINE HCL 0.5 % OP SOLN
1.0000 [drp] | OPHTHALMIC | Status: AC | PRN
  Administered 2022-09-14 (×3): 1 [drp] via OPHTHALMIC

## 2022-09-14 MED ORDER — MIDAZOLAM HCL 2 MG/2ML IJ SOLN
INTRAMUSCULAR | Status: AC
  Filled 2022-09-14: qty 2

## 2022-09-14 MED ORDER — SODIUM HYALURONATE 10 MG/ML IO SOLUTION
PREFILLED_SYRINGE | INTRAOCULAR | Status: DC | PRN
  Administered 2022-09-14: .85 mL via INTRAOCULAR

## 2022-09-14 MED ORDER — POVIDONE-IODINE 5 % OP SOLN
OPHTHALMIC | Status: DC | PRN
  Administered 2022-09-14: 1 via OPHTHALMIC

## 2022-09-14 SURGICAL SUPPLY — 19 items
CATARACT SUITE SIGHTPATH (MISCELLANEOUS) ×1 IMPLANT
CLIP IPRISM (KITS) IMPLANT
CLOTH BEACON ORANGE TIMEOUT ST (SAFETY) ×1 IMPLANT
DRAPE HALF SHEET 40X57 (DRAPES) IMPLANT
EYE SHIELD UNIVERSAL CLEAR (GAUZE/BANDAGES/DRESSINGS) IMPLANT
FEE CATARACT SUITE SIGHTPATH (MISCELLANEOUS) ×1 IMPLANT
GLOVE BIOGEL PI IND STRL 6.5 (GLOVE) IMPLANT
GLOVE BIOGEL PI IND STRL 7.0 (GLOVE) ×2 IMPLANT
LENS IOL RAYNER 13.5 (Intraocular Lens) ×1 IMPLANT
LENS IOL RAYONE EMV 13.5 (Intraocular Lens) IMPLANT
NDL HYPO 18GX1.5 BLUNT FILL (NEEDLE) ×1 IMPLANT
NEEDLE HYPO 18GX1.5 BLUNT FILL (NEEDLE) ×1 IMPLANT
PAD ARMBOARD 7.5X6 YLW CONV (MISCELLANEOUS) ×1 IMPLANT
RING MALYGIN 7.0 (MISCELLANEOUS) IMPLANT
SYR TB 1ML LL NO SAFETY (SYRINGE) ×1 IMPLANT
TAPE SURG TRANSPORE 1 IN (GAUZE/BANDAGES/DRESSINGS) IMPLANT
TAPE SURGICAL TRANSPORE 1 IN (GAUZE/BANDAGES/DRESSINGS) ×1
TREPHINE GLAUKO IACCESS TRBCLR (OPHTHALMIC) IMPLANT
WATER STERILE IRR 250ML POUR (IV SOLUTION) ×1 IMPLANT

## 2022-09-14 NOTE — Anesthesia Preprocedure Evaluation (Signed)
Anesthesia Evaluation  Patient identified by MRN, date of birth, ID band Patient awake    Reviewed: Allergy & Precautions, H&P , NPO status , Patient's Chart, lab work & pertinent test results, reviewed documented beta blocker date and time   Airway Mallampati: II  TM Distance: >3 FB Neck ROM: full    Dental no notable dental hx.    Pulmonary neg pulmonary ROS, former smoker   Pulmonary exam normal breath sounds clear to auscultation       Cardiovascular Exercise Tolerance: Good hypertension, negative cardio ROS  Rhythm:regular Rate:Normal     Neuro/Psych negative neurological ROS  negative psych ROS   GI/Hepatic negative GI ROS, Neg liver ROS,,,  Endo/Other  negative endocrine ROSdiabetes, Type 2    Renal/GU negative Renal ROS  negative genitourinary   Musculoskeletal   Abdominal   Peds  Hematology negative hematology ROS (+)   Anesthesia Other Findings   Reproductive/Obstetrics negative OB ROS                             Anesthesia Physical Anesthesia Plan  ASA: 2  Anesthesia Plan: MAC   Post-op Pain Management:    Induction:   PONV Risk Score and Plan:   Airway Management Planned:   Additional Equipment:   Intra-op Plan:   Post-operative Plan:   Informed Consent: I have reviewed the patients History and Physical, chart, labs and discussed the procedure including the risks, benefits and alternatives for the proposed anesthesia with the patient or authorized representative who has indicated his/her understanding and acceptance.     Dental Advisory Given  Plan Discussed with: CRNA  Anesthesia Plan Comments:        Anesthesia Quick Evaluation

## 2022-09-14 NOTE — Op Note (Signed)
Date of procedure: 09/14/22  Pre-operative diagnosis:  1) Visually significant age-related cataract, Left Eye (H25.?1) 2) Pseudoexfolation Glaucoma, Indeterminate Stage, Left Eye 3) Poor dilation, left eye  Post-operative diagnosis: 1) Visually significant age-related cataract, Left Eye 2) Pseudoexfolation Glaucoma, Indeterminate Stage, Left Eye 3) Intra-operative floppy iris syndrome, left eye  Procedure:  1) Complex Removal of cataract via phacoemulsification and insertion of intra-ocular lens Rayner [id number] +13.5D into the capsular bag of the Left Eye 2) Goniotomy treatment of nasal trabecular meshwork with iAccess, Left Eye  Attending surgeon: Gerda Diss. Benn Tarver, MD, MA  Anesthesia: MAC, Topical Ak[place]  Complications: None  Estimated Blood Loss: <45m (minimal)  Specimens: None  Implants: As above  Indications:  Visually significant age-related cataract, Left Eye; Glaucoma, Left Eye  Procedure:  The patient was seen and identified in the pre-operative area. The operative eye was identified and dilated.  The operative eye was marked.  Topical anesthesia was administered to the operative eye.     The patient was then to the operative suite and placed in the supine position.  A timeout was performed confirming the patient, procedure to be performed, and all other relevant information.   The patient's face was prepped and draped in the usual fashion for intra-ocular surgery.  A lid speculum was placed into the operative eye and the surgical microscope moved into place and focused. Poor dilation of the iris was confirmed. An inferotemporal paracentesis was created using a 20 gauge paracentesis blade.  Shugarcaine was injected into the anterior chamber.  Viscoelastic was injected into the anterior chamber.  A temporal clear-corneal main wound incision was created using a 2.471mmicrokeratome. A 7.54m5malyugin ring was placed. A continuous curvilinear capsulorrhexis was initiated using an  irrigating cystitome and completed using capsulorrhexis forceps.  Hydrodissection and hydrodeliniation were performed.  Viscoelastic was injected into the anterior chamber.  A phacoemulsification handpiece and a chopper as a second instrument were used to remove the nucleus and epinucleus. The irrigation/aspiration handpiece was used to remove any remaining cortical material.   The capsular bag was reinflated with viscoelastic, checked, and found to be intact.  The intraocular lens was inserted into the capsular bag and dialed into place using a Kuglen hook.    The patient's head was repositioned.  The iAccess was used to extensively treat the nasal trabecular meshwork for 90 degrees for a total of 5 goniotomies under gonioscopy.  The patient's head was returned to neutral. The Malyugin ring was removed. The irrigation/aspiration handpiece was used to remove any remaining viscoelastic.  The clear corneal wound and paracentesis wounds were then hydrated and checked with Weck-Cels to be watertight.  The lid-speculum was removed.  The drape was removed.  The patient's face was cleaned with a wet and dry 4x4.  Moxifloxacin drops were instilled on the eye. A clear shield was taped over the eye. The patient was taken to the post-operative care unit in good condition, having tolerated the procedure well.  Post-Op Instructions: The patient will [facility] at [facility] a same day post-operative evaluation and will receive all other orders and instructions.

## 2022-09-14 NOTE — Transfer of Care (Signed)
Immediate Anesthesia Transfer of Care Note  Patient: Bruce Abbott  Procedure(s) Performed: CATARACT EXTRACTION PHACO AND INTRAOCULAR LENS PLACEMENT (IOC) with Goinotomy and iAccess (Left: Eye)  Patient Location: Short Stay  Anesthesia Type:MAC  Level of Consciousness: awake, alert , and oriented  Airway & Oxygen Therapy: Patient Spontanous Breathing and Patient connected to nasal cannula oxygen  Post-op Assessment: Report given to RN and Post -op Vital signs reviewed and stable  Post vital signs: Reviewed and stable  Last Vitals:  Vitals Value Taken Time  BP 133/74   Temp 98   Pulse 53   Resp 16   SpO2 96     Last Pain:  Vitals:   09/14/22 1133  TempSrc: Oral  PainSc: 0-No pain         Complications: No notable events documented.

## 2022-09-14 NOTE — Interval H&P Note (Signed)
History and Physical Interval Note:  09/14/2022 12:39 PM  Bruce Abbott  has presented today for surgery, with the diagnosis of nuclear sclerosis age related cataract; left.  The various methods of treatment have been discussed with the patient and family. After consideration of risks, benefits and other options for treatment, the patient has consented to  Procedure(s) with comments: CATARACT EXTRACTION PHACO AND INTRAOCULAR LENS PLACEMENT ([place]) (Left) - CDE as a surgical intervention.  The patient's history has been reviewed, patient examined, no change in status, stable for surgery.  I have reviewed the patient's chart and labs.  Questions were answered to the patient's satisfaction.     Baruch Goldmann

## 2022-09-14 NOTE — Discharge Instructions (Addendum)
Please discharge patient when stable, will follow up today with Dr. Wrzosek at the [facility] [place] office immediately following discharge.  Leave shield in place until visit.  All paperwork with discharge instructions will be given at the office.  [facility] Address:  [address]  [place],  [address]  

## 2022-09-14 NOTE — Anesthesia Postprocedure Evaluation (Signed)
Anesthesia Post Note  Patient: FREMONT SKALICKY  Procedure(s) Performed: CATARACT EXTRACTION PHACO AND INTRAOCULAR LENS PLACEMENT (IOC) with Goinotomy and iAccess (Left: Eye)  Patient location during evaluation: Phase II Anesthesia Type: MAC Level of consciousness: awake Pain management: pain level controlled Vital Signs Assessment: post-procedure vital signs reviewed and stable Respiratory status: spontaneous breathing and respiratory function stable Cardiovascular status: blood pressure returned to baseline and stable Postop Assessment: no headache and no apparent nausea or vomiting Anesthetic complications: no Comments: Late entry   No notable events documented.   Last Vitals:  Vitals:   09/14/22 1133 09/14/22 1322  BP: (!) 142/67 134/79  Pulse: (!) 58 (!) 59  Resp: 16   Temp: 36.7 C 36.9 C  SpO2: 99% 99%    Last Pain:  Vitals:   09/14/22 1322  TempSrc: Oral  PainSc: 0-No pain                 Louann Sjogren

## 2022-09-17 ENCOUNTER — Encounter (HOSPITAL_COMMUNITY): Payer: Self-pay | Admitting: Ophthalmology

## 2022-09-21 DIAGNOSIS — H2511 Age-related nuclear cataract, right eye: Secondary | ICD-10-CM | POA: Diagnosis not present

## 2022-09-21 DIAGNOSIS — D518 Other vitamin B12 deficiency anemias: Secondary | ICD-10-CM | POA: Diagnosis not present

## 2022-09-21 DIAGNOSIS — Z0001 Encounter for general adult medical examination with abnormal findings: Secondary | ICD-10-CM | POA: Diagnosis not present

## 2022-09-21 DIAGNOSIS — E039 Hypothyroidism, unspecified: Secondary | ICD-10-CM | POA: Diagnosis not present

## 2022-09-21 DIAGNOSIS — E559 Vitamin D deficiency, unspecified: Secondary | ICD-10-CM | POA: Diagnosis not present

## 2022-09-21 DIAGNOSIS — E663 Overweight: Secondary | ICD-10-CM | POA: Diagnosis not present

## 2022-09-21 DIAGNOSIS — Z125 Encounter for screening for malignant neoplasm of prostate: Secondary | ICD-10-CM | POA: Diagnosis not present

## 2022-09-21 DIAGNOSIS — Z1331 Encounter for screening for depression: Secondary | ICD-10-CM | POA: Diagnosis not present

## 2022-09-21 DIAGNOSIS — Z6827 Body mass index (BMI) 27.0-27.9, adult: Secondary | ICD-10-CM | POA: Diagnosis not present

## 2022-09-21 DIAGNOSIS — E1122 Type 2 diabetes mellitus with diabetic chronic kidney disease: Secondary | ICD-10-CM | POA: Diagnosis not present

## 2022-09-22 ENCOUNTER — Encounter (HOSPITAL_COMMUNITY): Payer: Self-pay

## 2022-09-22 ENCOUNTER — Encounter (HOSPITAL_COMMUNITY)
Admission: RE | Admit: 2022-09-22 | Discharge: 2022-09-22 | Disposition: A | Discharge status: Home / Self Care | Payer: Medicare HMO | Source: Ambulatory Visit | Attending: Ophthalmology | Admitting: Ophthalmology

## 2022-09-22 NOTE — H&P (Signed)
Surgical History & Physical  Patient Name: Bruce Abbott DOB: [birth year]  Surgery: Cataract extraction with intraocular lens implant phacoemulsification; Right Eye  Surgeon: Baruch Goldmann MD Surgery Date:  09-28-22 Pre-Op Date:  09-21-22  HPI: A 4 Yr. old male patient 1. The patient is returning after cataract surgery. The left eye is affected. Status post cataract surgery, which began 1 weeks ago: Since the last visit, the affected area is doing well. Patient is following medication instructions: Latanoprost qhs OS and Combo TID OS. 2. 2. Big difference between the two eye since having cataract sx. Difficulties due to glare during the daytime and at night. This is negatively affecting the patient's quality of life and the patient is unable to function adequately in life with the current level of vision. Patient elects to proceed with cataract sx OD. HPI was performed by Baruch Goldmann .  Medical History: Retinal Detachment Cataracts Diabetes - DM Type 2, NIDDM High Blood Pressure LDL Neuropathy  Review of Systems Negative Allergic/Immunologic Negative Cardiovascular Negative Constitutional Negative Ear, Nose, Mouth & Throat Negative Endocrine Negative Eyes Negative Gastrointestinal Negative Genitourinary Negative Hemotologic/Lymphatic Negative Integumentary Negative Musculoskeletal Negative Neurological Negative Psychiatry Negative Respiratory  Social   Former smoker / Cigarettes   Medication Systane Complete, Latanoprost, Prednisolone-Moxifloxacin-Bromfenac,  Allopurinol, Metformin, Rosuvastatin, Lisinopril-HCTZ, CoQ10, Omega 3 Supplement, Aspirin, Gemfibrozil, Gabapentin, Indomethacin,   Sx/Procedures Retinal Detachment Repair (Scleral Buckle), Phaco c IOL OS,  Gallbladder Sx, Ganglion cysts- wrist, Tonsilectomy, Appendectomy,   Drug Allergies   NKDA  History & Physical: Heent: Cataract, right eye NECK: supple without bruits LUNGS: lungs clear to  auscultation CV: regular rate and rhythm Abdomen: soft and non-tender Impression & Plan: Assessment: 1.  CATARACT EXTRACTION STATUS; Left Eye (Z98.42) 2.  NUCLEAR SCLEROSIS AGE RELATED; Right Eye (H25.11) 3.  Myopia ; Right Eye (H52.11)  Plan: 1.  1 week after cataract surgery. Doing well with improved vision and normal eye pressure. Call with any problems or concerns. Continue Pred-Moxi-Brom 2x/day for 3 more weeks.  2.  Cataract accounts for the patient's decreased vision. This visual impairment is not correctable with a tolerable change in glasses or contact lenses. Cataract surgery with an implantation of a new lens should significantly improve the visual and functional status of the patient. Discussed all risks, benefits, alternatives, and potential complications. Discussed the procedures and recovery. Patient desires to have surgery. A-scan ordered and performed today for intra-ocular lens calculations. The surgery will be performed in order to improve vision for driving, reading, and for eye examinations. Recommend phacoemulsification with intra-ocular lens. Recommend Dextenza for post-operative pain and inflammation. Right Eye. Surgery required to correct imbalance of vision. Dilates well - shugarcaine by protocol.  3.

## 2022-09-28 ENCOUNTER — Ambulatory Visit (HOSPITAL_COMMUNITY)
Admission: RE | Admit: 2022-09-28 | Discharge: 2022-09-28 | Disposition: A | Discharge status: Home / Self Care | Payer: Medicare HMO | Source: Ambulatory Visit | Attending: Ophthalmology | Admitting: Ophthalmology

## 2022-09-28 ENCOUNTER — Encounter (HOSPITAL_COMMUNITY): Payer: Self-pay | Admitting: Ophthalmology

## 2022-09-28 ENCOUNTER — Ambulatory Visit (HOSPITAL_BASED_OUTPATIENT_CLINIC_OR_DEPARTMENT_OTHER): Payer: Medicare HMO | Admitting: Anesthesiology

## 2022-09-28 ENCOUNTER — Ambulatory Visit (HOSPITAL_COMMUNITY): Payer: Medicare HMO | Admitting: Anesthesiology

## 2022-09-28 ENCOUNTER — Encounter (HOSPITAL_COMMUNITY)
Admission: RE | Disposition: A | Discharge status: Home / Self Care | Payer: Self-pay | Source: Ambulatory Visit | Attending: Ophthalmology

## 2022-09-28 DIAGNOSIS — H2511 Age-related nuclear cataract, right eye: Secondary | ICD-10-CM | POA: Diagnosis not present

## 2022-09-28 DIAGNOSIS — Z79899 Other long term (current) drug therapy: Secondary | ICD-10-CM | POA: Insufficient documentation

## 2022-09-28 DIAGNOSIS — Z9842 Cataract extraction status, left eye: Secondary | ICD-10-CM | POA: Diagnosis not present

## 2022-09-28 DIAGNOSIS — E1140 Type 2 diabetes mellitus with diabetic neuropathy, unspecified: Secondary | ICD-10-CM | POA: Insufficient documentation

## 2022-09-28 DIAGNOSIS — E1136 Type 2 diabetes mellitus with diabetic cataract: Secondary | ICD-10-CM | POA: Insufficient documentation

## 2022-09-28 DIAGNOSIS — I10 Essential (primary) hypertension: Secondary | ICD-10-CM

## 2022-09-28 DIAGNOSIS — H5211 Myopia, right eye: Secondary | ICD-10-CM | POA: Insufficient documentation

## 2022-09-28 DIAGNOSIS — Z7984 Long term (current) use of oral hypoglycemic drugs: Secondary | ICD-10-CM

## 2022-09-28 DIAGNOSIS — Z87891 Personal history of nicotine dependence: Secondary | ICD-10-CM

## 2022-09-28 DIAGNOSIS — E119 Type 2 diabetes mellitus without complications: Secondary | ICD-10-CM | POA: Diagnosis not present

## 2022-09-28 HISTORY — PX: CATARACT EXTRACTION W/PHACO: SHX586

## 2022-09-28 LAB — GLUCOSE, CAPILLARY: Glucose-Capillary: 181 mg/dL — ABNORMAL HIGH (ref 70–99)

## 2022-09-28 SURGERY — PHACOEMULSIFICATION, CATARACT, WITH IOL INSERTION
Anesthesia: Monitor Anesthesia Care | Site: Eye | Laterality: Right

## 2022-09-28 MED ORDER — MIDAZOLAM HCL 2 MG/2ML IJ SOLN
INTRAMUSCULAR | Status: AC
  Filled 2022-09-28: qty 2

## 2022-09-28 MED ORDER — TETRACAINE HCL 0.5 % OP SOLN
1.0000 [drp] | OPHTHALMIC | Status: AC | PRN
  Administered 2022-09-28 (×3): 1 [drp] via OPHTHALMIC

## 2022-09-28 MED ORDER — EPINEPHRINE PF 1 MG/ML IJ SOLN
INTRAMUSCULAR | Status: AC
  Filled 2022-09-28: qty 2

## 2022-09-28 MED ORDER — EPINEPHRINE PF 1 MG/ML IJ SOLN
INTRAOCULAR | Status: DC | PRN
  Administered 2022-09-28: 500 mL

## 2022-09-28 MED ORDER — MIDAZOLAM HCL 5 MG/5ML IJ SOLN
INTRAMUSCULAR | Status: DC | PRN
  Administered 2022-09-28: 2 mg via INTRAVENOUS

## 2022-09-28 MED ORDER — SODIUM HYALURONATE 10 MG/ML IO SOLUTION
PREFILLED_SYRINGE | INTRAOCULAR | Status: DC | PRN
  Administered 2022-09-28: .85 mL via INTRAOCULAR

## 2022-09-28 MED ORDER — MOXIFLOXACIN HCL 5 MG/ML IO SOLN
INTRAOCULAR | Status: AC
  Filled 2022-09-28: qty 1

## 2022-09-28 MED ORDER — POVIDONE-IODINE 5 % OP SOLN
OPHTHALMIC | Status: DC | PRN
  Administered 2022-09-28: 1 via OPHTHALMIC

## 2022-09-28 MED ORDER — MOXIFLOXACIN HCL 0.5 % OP SOLN
OPHTHALMIC | Status: DC | PRN
  Administered 2022-09-28: .3 mL via OPHTHALMIC

## 2022-09-28 MED ORDER — STERILE WATER FOR IRRIGATION IR SOLN
Status: DC | PRN
  Administered 2022-09-28: 25 mL

## 2022-09-28 MED ORDER — LIDOCAINE HCL 3.5 % OP GEL
1.0000 | Freq: Once | OPHTHALMIC | Status: AC
  Administered 2022-09-28: 1 via OPHTHALMIC

## 2022-09-28 MED ORDER — BSS IO SOLN
INTRAOCULAR | Status: DC | PRN
  Administered 2022-09-28: 15 mL via INTRAOCULAR

## 2022-09-28 MED ORDER — SODIUM HYALURONATE 23MG/ML IO SOSY
PREFILLED_SYRINGE | INTRAOCULAR | Status: DC | PRN
  Administered 2022-09-28: .6 mL via INTRAOCULAR

## 2022-09-28 MED ORDER — LIDOCAINE HCL (PF) 1 % IJ SOLN
INTRAOCULAR | Status: DC | PRN
  Administered 2022-09-28: 1 mL via OPHTHALMIC

## 2022-09-28 MED ORDER — TROPICAMIDE 1 % OP SOLN
1.0000 [drp] | OPHTHALMIC | Status: AC | PRN
  Administered 2022-09-28 (×3): 1 [drp] via OPHTHALMIC

## 2022-09-28 MED ORDER — PHENYLEPHRINE HCL 2.5 % OP SOLN
1.0000 [drp] | OPHTHALMIC | Status: AC | PRN
  Administered 2022-09-28 (×3): 1 [drp] via OPHTHALMIC

## 2022-09-28 SURGICAL SUPPLY — 15 items
CATARACT SUITE SIGHTPATH (MISCELLANEOUS) ×1 IMPLANT
CLOTH BEACON ORANGE TIMEOUT ST (SAFETY) ×1 IMPLANT
EYE SHIELD UNIVERSAL CLEAR (GAUZE/BANDAGES/DRESSINGS) IMPLANT
FEE CATARACT SUITE SIGHTPATH (MISCELLANEOUS) ×1 IMPLANT
GLOVE BIOGEL PI IND STRL 7.0 (GLOVE) ×2 IMPLANT
GLOVE SS BIOGEL STRL SZ 6.5 (GLOVE) IMPLANT
LENS IOL RAYNER 15.5 (Intraocular Lens) ×1 IMPLANT
LENS IOL RAYONE EMV 15.5 (Intraocular Lens) IMPLANT
NDL HYPO 18GX1.5 BLUNT FILL (NEEDLE) ×1 IMPLANT
NEEDLE HYPO 18GX1.5 BLUNT FILL (NEEDLE) ×1 IMPLANT
PAD ARMBOARD 7.5X6 YLW CONV (MISCELLANEOUS) ×1 IMPLANT
SYR TB 1ML LL NO SAFETY (SYRINGE) ×1 IMPLANT
TAPE SURG TRANSPORE 1 IN (GAUZE/BANDAGES/DRESSINGS) IMPLANT
TAPE SURGICAL TRANSPORE 1 IN (GAUZE/BANDAGES/DRESSINGS) ×1
WATER STERILE IRR 250ML POUR (IV SOLUTION) ×1 IMPLANT

## 2022-09-28 NOTE — Anesthesia Preprocedure Evaluation (Addendum)
Anesthesia Evaluation  Patient identified by MRN, date of birth, ID band Patient awake    Reviewed: Allergy & Precautions, H&P , NPO status , Patient's Chart, lab work & pertinent test results  Airway Mallampati: II  TM Distance: >3 FB Neck ROM: Full    Dental  (+) Dental Advisory Given Crowns :   Pulmonary former smoker   Pulmonary exam normal breath sounds clear to auscultation       Cardiovascular Exercise Tolerance: Good hypertension, Pt. on medications Normal cardiovascular exam Rhythm:Regular Rate:Normal     Neuro/Psych negative neurological ROS  negative psych ROS   GI/Hepatic negative GI ROS, Neg liver ROS,,,  Endo/Other  diabetes, Well Controlled, Type 2, Oral Hypoglycemic Agents    Renal/GU negative Renal ROS  negative genitourinary   Musculoskeletal negative musculoskeletal ROS (+)    Abdominal   Peds negative pediatric ROS (+)  Hematology negative hematology ROS (+)   Anesthesia Other Findings   Reproductive/Obstetrics negative OB ROS                             Anesthesia Physical Anesthesia Plan  ASA: 2  Anesthesia Plan: MAC   Post-op Pain Management: Minimal or no pain anticipated   Induction:   PONV Risk Score and Plan: 0  Airway Management Planned: Nasal Cannula and Natural Airway  Additional Equipment:   Intra-op Plan:   Post-operative Plan:   Informed Consent: I have reviewed the patients History and Physical, chart, labs and discussed the procedure including the risks, benefits and alternatives for the proposed anesthesia with the patient or authorized representative who has indicated his/her understanding and acceptance.     Dental advisory given  Plan Discussed with: CRNA and Surgeon  Anesthesia Plan Comments:        Anesthesia Quick Evaluation

## 2022-09-28 NOTE — Anesthesia Postprocedure Evaluation (Signed)
Anesthesia Post Note  Patient: Bruce Abbott  Procedure(s) Performed: CATARACT EXTRACTION PHACO AND INTRAOCULAR LENS PLACEMENT (IOC) (Right: Eye)  Patient location during evaluation: Phase II Anesthesia Type: MAC Level of consciousness: awake and alert and oriented Pain management: pain level controlled Vital Signs Assessment: post-procedure vital signs reviewed and stable Respiratory status: spontaneous breathing, nonlabored ventilation and respiratory function stable Cardiovascular status: blood pressure returned to baseline and stable Postop Assessment: no apparent nausea or vomiting Anesthetic complications: no  No notable events documented.   Last Vitals:  Vitals:   09/28/22 1150 09/28/22 1308  BP: 122/61 108/70  Pulse: (!) 58 62  Resp: 16 19  Temp: 36.7 C 36.7 C  SpO2: 96% 98%    Last Pain:  Vitals:   09/28/22 1308  TempSrc:   PainSc: 0-No pain                 Zeph Riebel C Geremiah Fussell

## 2022-09-28 NOTE — Transfer of Care (Signed)
Immediate Anesthesia Transfer of Care Note  Patient: Bruce Abbott  Procedure(s) Performed: CATARACT EXTRACTION PHACO AND INTRAOCULAR LENS PLACEMENT (IOC) (Right: Eye)  Patient Location: PACU  Anesthesia Type:MAC  Level of Consciousness: awake, alert , oriented, and patient cooperative  Airway & Oxygen Therapy: Patient Spontanous Breathing  Post-op Assessment: Report given to RN, Post -op Vital signs reviewed and stable, and Patient moving all extremities X 4  Post vital signs: Reviewed and stable  Last Vitals:  Vitals Value Taken Time  BP    Temp    Pulse    Resp    SpO2      Last Pain:  Vitals:   09/28/22 1150  TempSrc: Oral  PainSc: 0-No pain         Complications: No notable events documented. VSS.

## 2022-09-28 NOTE — Op Note (Signed)
Date of procedure: 09/28/22  Pre-operative diagnosis: Visually significant age-related nuclear cataract, Right Eye (H25.11)  Post-operative diagnosis: Visually significant age-related nuclear cataract, Right Eye  Procedure: Removal of cataract via phacoemulsification and insertion of intra-ocular lens Rayner [id number] +15.5D into the capsular bag of the Right Eye  Attending surgeon: Gerda Diss. Tekeisha Hakim, MD, MA  Anesthesia: MAC, Topical Akten  Complications: None  Estimated Blood Loss: <6m (minimal)  Specimens: None  Implants: As above  Indications:  Visually significant age-related cataract, Right Eye  Procedure:  The patient was seen and identified in the pre-operative area. The operative eye was identified and dilated.  The operative eye was marked.  Topical anesthesia was administered to the operative eye.     The patient was then to the operative suite and placed in the supine position.  A timeout was performed confirming the patient, procedure to be performed, and all other relevant information.   The patient's face was prepped and draped in the usual fashion for intra-ocular surgery.  A lid speculum was placed into the operative eye and the surgical microscope moved into place and focused.  A superotemporal paracentesis was created using a 20 gauge paracentesis blade.  Shugarcaine was injected into the anterior chamber.  Viscoelastic was injected into the anterior chamber.  A temporal clear-corneal main wound incision was created using a 2.453mmicrokeratome.  A continuous curvilinear capsulorrhexis was initiated using an irrigating cystitome and completed using capsulorrhexis forceps.  Hydrodissection and hydrodeliniation were performed.  Viscoelastic was injected into the anterior chamber.  A phacoemulsification handpiece and a chopper as a second instrument were used to remove the nucleus and epinucleus. The irrigation/aspiration handpiece was used to remove any remaining cortical  material.   The capsular bag was reinflated with viscoelastic, checked, and found to be intact.  The intraocular lens was inserted into the capsular bag.  The irrigation/aspiration handpiece was used to remove any remaining viscoelastic.  The clear corneal wound and paracentesis wounds were then hydrated and checked with Weck-Cels to be watertight. 0.70m53mf moxifloxacin was injected into the anterior chamber. The lid-speculum and drape was removed, and the patient's face was cleaned with a wet and dry 4x4.  A clear shield was taped over the eye. The patient was taken to the post-operative care unit in good condition, having tolerated the procedure well.  Post-Op Instructions: The patient will follow up at [facility] a same day post-operative evaluation and will receive all other orders and instructions.

## 2022-09-28 NOTE — Discharge Instructions (Signed)
Please discharge patient when stable, will follow up today with Dr. Caesar Mannella at the [facility] [place] office immediately following discharge.  Leave shield in place until visit.  All paperwork with discharge instructions will be given at the office.  [facility] Address:  [address]  [place], [place] [address]  

## 2022-09-28 NOTE — Interval H&P Note (Signed)
History and Physical Interval Note:  09/28/2022 12:10 PM  Bruce Abbott  has presented today for surgery, with the diagnosis of nuclear sclerosis age related cataract; right.  The various methods of treatment have been discussed with the patient and family. After consideration of risks, benefits and other options for treatment, the patient has consented to  Procedure(s) with comments: CATARACT EXTRACTION PHACO AND INTRAOCULAR LENS PLACEMENT (IOC) (Right) - CDE: as a surgical intervention.  The patient's history has been reviewed, patient examined, no change in status, stable for surgery.  I have reviewed the patient's chart and labs.  Questions were answered to the patient's satisfaction.     Baruch Goldmann

## 2022-10-02 ENCOUNTER — Encounter (HOSPITAL_COMMUNITY): Payer: Self-pay | Admitting: Ophthalmology

## 2022-10-07 DIAGNOSIS — D518 Other vitamin B12 deficiency anemias: Secondary | ICD-10-CM | POA: Diagnosis not present

## 2022-10-29 DIAGNOSIS — H524 Presbyopia: Secondary | ICD-10-CM | POA: Diagnosis not present

## 2022-10-29 DIAGNOSIS — H52223 Regular astigmatism, bilateral: Secondary | ICD-10-CM | POA: Diagnosis not present

## 2022-12-17 DIAGNOSIS — D518 Other vitamin B12 deficiency anemias: Secondary | ICD-10-CM | POA: Diagnosis not present

## 2023-01-04 DIAGNOSIS — H01001 Unspecified blepharitis right upper eyelid: Secondary | ICD-10-CM | POA: Diagnosis not present

## 2023-01-04 DIAGNOSIS — H01004 Unspecified blepharitis left upper eyelid: Secondary | ICD-10-CM | POA: Diagnosis not present

## 2023-01-04 DIAGNOSIS — H401424 Capsular glaucoma with pseudoexfoliation of lens, left eye, indeterminate stage: Secondary | ICD-10-CM | POA: Diagnosis not present

## 2023-01-04 DIAGNOSIS — H01002 Unspecified blepharitis right lower eyelid: Secondary | ICD-10-CM | POA: Diagnosis not present

## 2023-01-04 DIAGNOSIS — D649 Anemia, unspecified: Secondary | ICD-10-CM | POA: Diagnosis not present

## 2023-01-28 DIAGNOSIS — H524 Presbyopia: Secondary | ICD-10-CM | POA: Diagnosis not present

## 2023-01-28 DIAGNOSIS — H52223 Regular astigmatism, bilateral: Secondary | ICD-10-CM | POA: Diagnosis not present

## 2023-02-02 DIAGNOSIS — D518 Other vitamin B12 deficiency anemias: Secondary | ICD-10-CM | POA: Diagnosis not present

## 2023-03-18 DIAGNOSIS — I10 Essential (primary) hypertension: Secondary | ICD-10-CM | POA: Diagnosis not present

## 2023-03-18 DIAGNOSIS — E1122 Type 2 diabetes mellitus with diabetic chronic kidney disease: Secondary | ICD-10-CM | POA: Diagnosis not present

## 2023-03-18 DIAGNOSIS — E663 Overweight: Secondary | ICD-10-CM | POA: Diagnosis not present

## 2023-03-18 DIAGNOSIS — E559 Vitamin D deficiency, unspecified: Secondary | ICD-10-CM | POA: Diagnosis not present

## 2023-03-18 DIAGNOSIS — D649 Anemia, unspecified: Secondary | ICD-10-CM | POA: Diagnosis not present

## 2023-03-18 DIAGNOSIS — Z6826 Body mass index (BMI) 26.0-26.9, adult: Secondary | ICD-10-CM | POA: Diagnosis not present

## 2023-03-18 DIAGNOSIS — D518 Other vitamin B12 deficiency anemias: Secondary | ICD-10-CM | POA: Diagnosis not present

## 2023-03-18 DIAGNOSIS — R5383 Other fatigue: Secondary | ICD-10-CM | POA: Diagnosis not present

## 2023-03-18 DIAGNOSIS — N183 Chronic kidney disease, stage 3 unspecified: Secondary | ICD-10-CM | POA: Diagnosis not present

## 2023-04-05 DIAGNOSIS — H01002 Unspecified blepharitis right lower eyelid: Secondary | ICD-10-CM | POA: Diagnosis not present

## 2023-04-05 DIAGNOSIS — H01001 Unspecified blepharitis right upper eyelid: Secondary | ICD-10-CM | POA: Diagnosis not present

## 2023-04-05 DIAGNOSIS — H401424 Capsular glaucoma with pseudoexfoliation of lens, left eye, indeterminate stage: Secondary | ICD-10-CM | POA: Diagnosis not present

## 2023-04-05 DIAGNOSIS — H01004 Unspecified blepharitis left upper eyelid: Secondary | ICD-10-CM | POA: Diagnosis not present

## 2023-04-06 DIAGNOSIS — E119 Type 2 diabetes mellitus without complications: Secondary | ICD-10-CM | POA: Diagnosis not present

## 2023-05-11 DIAGNOSIS — D518 Other vitamin B12 deficiency anemias: Secondary | ICD-10-CM | POA: Diagnosis not present

## 2023-07-26 DIAGNOSIS — L432 Lichenoid drug reaction: Secondary | ICD-10-CM | POA: Diagnosis not present

## 2023-08-02 DIAGNOSIS — E1122 Type 2 diabetes mellitus with diabetic chronic kidney disease: Secondary | ICD-10-CM | POA: Diagnosis not present

## 2023-08-02 DIAGNOSIS — E538 Deficiency of other specified B group vitamins: Secondary | ICD-10-CM | POA: Diagnosis not present

## 2023-08-02 DIAGNOSIS — E1140 Type 2 diabetes mellitus with diabetic neuropathy, unspecified: Secondary | ICD-10-CM | POA: Diagnosis not present

## 2023-08-06 DIAGNOSIS — Z23 Encounter for immunization: Secondary | ICD-10-CM | POA: Diagnosis not present

## 2023-08-06 DIAGNOSIS — E538 Deficiency of other specified B group vitamins: Secondary | ICD-10-CM | POA: Diagnosis not present

## 2023-08-09 DIAGNOSIS — H401421 Capsular glaucoma with pseudoexfoliation of lens, left eye, mild stage: Secondary | ICD-10-CM | POA: Diagnosis not present

## 2023-08-09 DIAGNOSIS — H01002 Unspecified blepharitis right lower eyelid: Secondary | ICD-10-CM | POA: Diagnosis not present

## 2023-08-09 DIAGNOSIS — H01005 Unspecified blepharitis left lower eyelid: Secondary | ICD-10-CM | POA: Diagnosis not present

## 2023-08-09 DIAGNOSIS — H01001 Unspecified blepharitis right upper eyelid: Secondary | ICD-10-CM | POA: Diagnosis not present

## 2023-08-09 DIAGNOSIS — H02831 Dermatochalasis of right upper eyelid: Secondary | ICD-10-CM | POA: Diagnosis not present

## 2023-08-09 DIAGNOSIS — H01004 Unspecified blepharitis left upper eyelid: Secondary | ICD-10-CM | POA: Diagnosis not present

## 2023-08-09 DIAGNOSIS — H11823 Conjunctivochalasis, bilateral: Secondary | ICD-10-CM | POA: Diagnosis not present

## 2023-08-09 DIAGNOSIS — H11153 Pinguecula, bilateral: Secondary | ICD-10-CM | POA: Diagnosis not present

## 2023-08-09 DIAGNOSIS — H02834 Dermatochalasis of left upper eyelid: Secondary | ICD-10-CM | POA: Diagnosis not present

## 2023-09-14 DIAGNOSIS — E1122 Type 2 diabetes mellitus with diabetic chronic kidney disease: Secondary | ICD-10-CM | POA: Diagnosis not present

## 2023-09-14 DIAGNOSIS — M109 Gout, unspecified: Secondary | ICD-10-CM | POA: Diagnosis not present

## 2023-09-14 DIAGNOSIS — I10 Essential (primary) hypertension: Secondary | ICD-10-CM | POA: Diagnosis not present

## 2023-09-14 DIAGNOSIS — Z6828 Body mass index (BMI) 28.0-28.9, adult: Secondary | ICD-10-CM | POA: Diagnosis not present

## 2023-09-14 DIAGNOSIS — E1140 Type 2 diabetes mellitus with diabetic neuropathy, unspecified: Secondary | ICD-10-CM | POA: Diagnosis not present

## 2023-09-14 DIAGNOSIS — E663 Overweight: Secondary | ICD-10-CM | POA: Diagnosis not present

## 2023-09-14 DIAGNOSIS — N183 Chronic kidney disease, stage 3 unspecified: Secondary | ICD-10-CM | POA: Diagnosis not present

## 2023-09-14 DIAGNOSIS — E1165 Type 2 diabetes mellitus with hyperglycemia: Secondary | ICD-10-CM | POA: Diagnosis not present

## 2023-09-14 DIAGNOSIS — E538 Deficiency of other specified B group vitamins: Secondary | ICD-10-CM | POA: Diagnosis not present

## 2023-10-22 DIAGNOSIS — E538 Deficiency of other specified B group vitamins: Secondary | ICD-10-CM | POA: Diagnosis not present

## 2023-11-02 DIAGNOSIS — E1122 Type 2 diabetes mellitus with diabetic chronic kidney disease: Secondary | ICD-10-CM | POA: Diagnosis not present

## 2023-11-02 DIAGNOSIS — E782 Mixed hyperlipidemia: Secondary | ICD-10-CM | POA: Diagnosis not present

## 2023-11-12 ENCOUNTER — Other Ambulatory Visit (HOSPITAL_COMMUNITY): Payer: Self-pay | Admitting: Family Medicine

## 2023-11-12 ENCOUNTER — Ambulatory Visit (HOSPITAL_COMMUNITY)
Admission: RE | Admit: 2023-11-12 | Discharge: 2023-11-12 | Disposition: A | Discharge status: Home / Self Care | Payer: Self-pay | Source: Ambulatory Visit | Attending: Family Medicine | Admitting: Family Medicine

## 2023-11-12 DIAGNOSIS — E6609 Other obesity due to excess calories: Secondary | ICD-10-CM | POA: Diagnosis not present

## 2023-11-12 DIAGNOSIS — R059 Cough, unspecified: Secondary | ICD-10-CM

## 2023-11-12 DIAGNOSIS — R053 Chronic cough: Secondary | ICD-10-CM | POA: Diagnosis not present

## 2023-11-12 DIAGNOSIS — Z6829 Body mass index (BMI) 29.0-29.9, adult: Secondary | ICD-10-CM | POA: Diagnosis not present

## 2023-11-19 ENCOUNTER — Other Ambulatory Visit (HOSPITAL_COMMUNITY): Payer: Self-pay | Admitting: Internal Medicine

## 2023-11-19 ENCOUNTER — Ambulatory Visit (HOSPITAL_COMMUNITY)
Admission: RE | Admit: 2023-11-19 | Discharge: 2023-11-19 | Disposition: A | Discharge status: Home / Self Care | Payer: HMO | Source: Ambulatory Visit | Attending: Internal Medicine | Admitting: Internal Medicine

## 2023-11-19 DIAGNOSIS — E1122 Type 2 diabetes mellitus with diabetic chronic kidney disease: Secondary | ICD-10-CM | POA: Diagnosis not present

## 2023-11-19 DIAGNOSIS — J189 Pneumonia, unspecified organism: Secondary | ICD-10-CM | POA: Diagnosis not present

## 2023-11-19 DIAGNOSIS — E1140 Type 2 diabetes mellitus with diabetic neuropathy, unspecified: Secondary | ICD-10-CM | POA: Diagnosis not present

## 2023-11-19 DIAGNOSIS — Z6828 Body mass index (BMI) 28.0-28.9, adult: Secondary | ICD-10-CM | POA: Diagnosis not present

## 2023-11-19 DIAGNOSIS — R053 Chronic cough: Secondary | ICD-10-CM

## 2023-11-19 DIAGNOSIS — E1165 Type 2 diabetes mellitus with hyperglycemia: Secondary | ICD-10-CM | POA: Diagnosis not present

## 2023-11-19 DIAGNOSIS — N183 Chronic kidney disease, stage 3 unspecified: Secondary | ICD-10-CM | POA: Diagnosis not present

## 2023-11-19 DIAGNOSIS — E663 Overweight: Secondary | ICD-10-CM | POA: Diagnosis not present

## 2023-11-19 DIAGNOSIS — R059 Cough, unspecified: Secondary | ICD-10-CM | POA: Diagnosis not present

## 2023-11-19 DIAGNOSIS — J9801 Acute bronchospasm: Secondary | ICD-10-CM | POA: Diagnosis not present

## 2023-12-16 DIAGNOSIS — H01002 Unspecified blepharitis right lower eyelid: Secondary | ICD-10-CM | POA: Diagnosis not present

## 2023-12-16 DIAGNOSIS — H01004 Unspecified blepharitis left upper eyelid: Secondary | ICD-10-CM | POA: Diagnosis not present

## 2023-12-16 DIAGNOSIS — H401421 Capsular glaucoma with pseudoexfoliation of lens, left eye, mild stage: Secondary | ICD-10-CM | POA: Diagnosis not present

## 2023-12-16 DIAGNOSIS — H01001 Unspecified blepharitis right upper eyelid: Secondary | ICD-10-CM | POA: Diagnosis not present

## 2024-01-07 DIAGNOSIS — E538 Deficiency of other specified B group vitamins: Secondary | ICD-10-CM | POA: Diagnosis not present

## 2024-03-01 DIAGNOSIS — E538 Deficiency of other specified B group vitamins: Secondary | ICD-10-CM | POA: Diagnosis not present

## 2024-03-13 DIAGNOSIS — E1140 Type 2 diabetes mellitus with diabetic neuropathy, unspecified: Secondary | ICD-10-CM | POA: Diagnosis not present

## 2024-03-13 DIAGNOSIS — E1122 Type 2 diabetes mellitus with diabetic chronic kidney disease: Secondary | ICD-10-CM | POA: Diagnosis not present

## 2024-03-13 DIAGNOSIS — E1165 Type 2 diabetes mellitus with hyperglycemia: Secondary | ICD-10-CM | POA: Diagnosis not present

## 2024-03-13 DIAGNOSIS — I10 Essential (primary) hypertension: Secondary | ICD-10-CM | POA: Diagnosis not present

## 2024-03-13 DIAGNOSIS — E538 Deficiency of other specified B group vitamins: Secondary | ICD-10-CM | POA: Diagnosis not present

## 2024-03-13 DIAGNOSIS — E039 Hypothyroidism, unspecified: Secondary | ICD-10-CM | POA: Diagnosis not present

## 2024-03-13 DIAGNOSIS — E782 Mixed hyperlipidemia: Secondary | ICD-10-CM | POA: Diagnosis not present

## 2024-03-13 DIAGNOSIS — E559 Vitamin D deficiency, unspecified: Secondary | ICD-10-CM | POA: Diagnosis not present

## 2024-03-13 DIAGNOSIS — N183 Chronic kidney disease, stage 3 unspecified: Secondary | ICD-10-CM | POA: Diagnosis not present

## 2024-03-13 DIAGNOSIS — Z6827 Body mass index (BMI) 27.0-27.9, adult: Secondary | ICD-10-CM | POA: Diagnosis not present

## 2024-03-13 DIAGNOSIS — E663 Overweight: Secondary | ICD-10-CM | POA: Diagnosis not present

## 2024-04-05 DIAGNOSIS — E119 Type 2 diabetes mellitus without complications: Secondary | ICD-10-CM | POA: Diagnosis not present

## 2024-05-08 DIAGNOSIS — E538 Deficiency of other specified B group vitamins: Secondary | ICD-10-CM | POA: Diagnosis not present

## 2024-05-09 ENCOUNTER — Encounter: Payer: Self-pay | Admitting: Family Medicine

## 2024-05-09 ENCOUNTER — Ambulatory Visit: Admitting: Family Medicine

## 2024-05-09 VITALS — BP 126/78 | HR 64 | Temp 97.6°F | Ht 72.0 in | Wt 194.6 lb

## 2024-05-09 DIAGNOSIS — M1A00X0 Idiopathic chronic gout, unspecified site, without tophus (tophi): Secondary | ICD-10-CM

## 2024-05-09 DIAGNOSIS — E785 Hyperlipidemia, unspecified: Secondary | ICD-10-CM | POA: Diagnosis not present

## 2024-05-09 DIAGNOSIS — E118 Type 2 diabetes mellitus with unspecified complications: Secondary | ICD-10-CM | POA: Diagnosis not present

## 2024-05-09 DIAGNOSIS — E538 Deficiency of other specified B group vitamins: Secondary | ICD-10-CM

## 2024-05-09 DIAGNOSIS — Z1211 Encounter for screening for malignant neoplasm of colon: Secondary | ICD-10-CM

## 2024-05-09 DIAGNOSIS — E1140 Type 2 diabetes mellitus with diabetic neuropathy, unspecified: Secondary | ICD-10-CM

## 2024-05-09 DIAGNOSIS — Z7984 Long term (current) use of oral hypoglycemic drugs: Secondary | ICD-10-CM

## 2024-05-09 NOTE — Progress Notes (Signed)
 Subjective:    Patient ID: Bruce Abbott, male    DOB: [birth year], 73 y.o.   MRN: [id number]  HPI Patient is a very pleasant 73 year old Caucasian gentleman here today to establish care.  Past medical history is significant for type 2 diabetes mellitus currently managed with metformin 1500 mg total daily.  He also has a history of dyslipidemia.  He is currently on gemfibrozil.  He states that he tried statins in the past but had muscle pain due to statin.  However he was on gemfibrozil while taking the statin.  He is currently on gemfibrozil with Zetia.  He denies any muscle pain at the present time.  He is on fish oil.  He also has a history of neuropathy, vitamin B12 deficiency currently on parenteral B12 injections, as well as gout.  His last colonoscopy was in 2015.  He states that he just had his prostate checked at his previous physician less than a year.  Has had a pneumonia vaccine as well as the shingles vaccine. Past Medical History:  Diagnosis Date   Diabetes mellitus without complication (HCC)    Gout    Hyperlipemia    Hypertension    Neuropathy    Past Surgical History:  Procedure Laterality Date   APPENDECTOMY     CATARACT EXTRACTION W/PHACO Left 09/14/2022   Procedure: CATARACT EXTRACTION PHACO AND INTRAOCULAR LENS PLACEMENT (IOC) with Goinotomy and iAccess;  Surgeon: Harrie Agent, MD;  Location: [facility];  Service: Ophthalmology;  Laterality: Left;  CDE 6.81   CATARACT EXTRACTION W/PHACO Right 09/28/2022   Procedure: CATARACT EXTRACTION PHACO AND INTRAOCULAR LENS PLACEMENT (IOC);  Surgeon: Harrie Agent, MD;  Location: [facility];  Service: Ophthalmology;  Laterality: Right;  CDE: 8.44   CHOLECYSTECTOMY     COLONOSCOPY  2005   Dr. Shaaron: internal hemorrhoids, diminutive rectal polyp, path not available. Left-sided diverticula   COLONOSCOPY N/A 06/12/2014   Procedure: COLONOSCOPY;  Surgeon: Lamar CHRISTELLA Shaaron, MD;  Location: [facility];  Service: Endoscopy;  Laterality: N/A;   8:45 AM rescheduled to 7:30 - Doris to notify pt   GANGLION CYST EXCISION     KNEE ARTHROSCOPY Left    TONSILLECTOMY     Current Outpatient Medications on File Prior to Visit  Medication Sig Dispense Refill   allopurinol (ZYLOPRIM) 300 MG tablet Take 300 mg by mouth daily.     aspirin EC 81 MG tablet Take 81 mg by mouth daily.     clotrimazole-betamethasone (LOTRISONE) cream APPLY TOPICALLY TO AFFECTED AND SURROUNDING AREAS OF SKIN TWICE DAILY IN THE MORNING AND THE EVENING     Coenzyme Q10 (CO Q 10) 100 MG CAPS Take 1 capsule by mouth daily.     gabapentin  (NEURONTIN ) 300 MG capsule Take 1 capsule (300 mg total) by mouth at bedtime. 30 capsule 0   gemfibrozil (LOPID) 600 MG tablet      lisinopril-hydrochlorothiazide (PRINZIDE,ZESTORETIC) 10-12.5 MG per tablet Take 1 tablet by mouth daily.     metFORMIN (GLUCOPHAGE) 500 MG tablet TAKE 1 TABLET BY MOUTH TWICE DAILY FOR 90 DAYS (Patient taking differently: Take 500 mg by mouth in the morning, at noon, and at bedtime.)     Omega-3 Krill Oil 500 MG CAPS Take 2 capsules by mouth daily.     ONETOUCH ULTRA test strip SMARTSIG:Via Meter     SHINGRIX injection      rosuvastatin (CRESTOR) 5 MG tablet Take 5 mg by mouth daily. (Patient not taking: Reported on 05/09/2024)  No current facility-administered medications on file prior to visit.   No Known Allergies Social History   Socioeconomic History   Marital status: Married    Spouse name: Not on file   Number of children: Not on file   Years of education: Not on file   Highest education level: Not on file  Occupational History   Not on file  Tobacco Use   Smoking status: Former    Current packs/day: 0.00    Types: Cigarettes    Quit date: 05/18/2007    Years since quitting: 16.9   Smokeless tobacco: Never   Tobacco comments:    Quit x 7-8 years  Vaping Use   Vaping status: Never Used  Substance and Sexual Activity   Alcohol use: Yes    Comment: occ   Drug use: No   Sexual  activity: Not on file  Other Topics Concern   Not on file  Social History Narrative   Not on file   Social Drivers of Health   Financial Resource Strain: Not on file  Food Insecurity: Not on file  Transportation Needs: Not on file  Physical Activity: Not on file  Stress: Not on file  Social Connections: Not on file  Intimate Partner Violence: Not on file   Family History  Problem Relation Age of Onset   Hypertension Mother    Heart disease Mother    Leukemia Father    Cancer Maternal Grandmother       Review of Systems  All other systems reviewed and are negative.      Objective:   Physical Exam Vitals reviewed.  Constitutional:      General: He is not in acute distress.    Appearance: Normal appearance. He is normal weight. He is not ill-appearing or toxic-appearing.  HENT:     Head: Normocephalic and atraumatic.     Right Ear: Tympanic membrane and ear canal normal.     Left Ear: Tympanic membrane and ear canal normal.     Nose: Nose normal. No congestion or rhinorrhea.     Mouth/Throat:     Mouth: Mucous membranes are moist.     Pharynx: Oropharynx is clear. No oropharyngeal exudate or posterior oropharyngeal erythema.  Eyes:     General:        Right eye: No discharge.        Left eye: No discharge.     Extraocular Movements: Extraocular movements intact.     Conjunctiva/sclera: Conjunctivae normal.     Pupils: Pupils are equal, round, and reactive to light.  Neck:     Vascular: No carotid bruit.  Cardiovascular:     Rate and Rhythm: Normal rate and regular rhythm.     Pulses: Normal pulses.     Heart sounds: Normal heart sounds. No murmur heard.    No gallop.  Pulmonary:     Effort: Pulmonary effort is normal. No respiratory distress.     Breath sounds: Normal breath sounds. No stridor. No wheezing, rhonchi or rales.  Abdominal:     General: Bowel sounds are normal. There is no distension.     Palpations: There is no mass.     Tenderness: There is  no abdominal tenderness. There is no guarding or rebound.     Hernia: No hernia is present.  Musculoskeletal:     Cervical back: Normal range of motion and neck supple.     Right lower leg: No edema.     Left lower leg: No  edema.  Lymphadenopathy:     Cervical: No cervical adenopathy.  Skin:    General: Skin is warm.     Coloration: Skin is not jaundiced.     Findings: No bruising, erythema, lesion or rash.  Neurological:     General: No focal deficit present.     Mental Status: He is alert and oriented to person, place, and time. Mental status is at baseline.     Cranial Nerves: No cranial nerve deficit.     Motor: No weakness.     Coordination: Coordination normal.     Gait: Gait normal.     Deep Tendon Reflexes: Reflexes normal.  Psychiatric:        Mood and Affect: Mood normal.        Behavior: Behavior normal.           Assessment & Plan:  1. Colon cancer screening (Primary) - Ambulatory referral to Gastroenterology  2. Controlled type 2 diabetes mellitus with complication, without long-term current use of insulin (HCC) - CT CARDIAC SCORING (SELF PAY ONLY); Future  3. Dyslipidemia  4. B12 deficiency - Vitamin B12; Future  5. Type 2 diabetes mellitus with diabetic neuropathy, unspecified whether long term insulin use (HCC) - Hemoglobin A1c; Future - CBC with Differential/Platelet; Future - Comprehensive metabolic panel with GFR; Future - Lipid panel; Future  6. Idiopathic chronic gout without tophus, unspecified site - Uric acid; Future  Patient just had blood work in May and just started Zetia.  Recommended we repeat all his labs in September.  At that time, if his LDL cholesterol is not at goal, I would recommend discontinuation of gemfibrozil and Zetia and challenge the patient again with a statin.  In the meantime I would recommend a coronary artery calcium score to risk stratify the patient.  If significantly elevated I would recommend trying a statin now  instead of Zetia and gemfibrozil.  Immunizations are up-to-date.  Blood pressure is excellent.  I will check a uric acid level as well as a B12 level at his next visit.  Recheck in September.  Patient can come by monthly for parenteral B12 injections

## 2024-05-11 ENCOUNTER — Encounter (INDEPENDENT_AMBULATORY_CARE_PROVIDER_SITE_OTHER): Payer: Self-pay | Admitting: *Deleted

## 2024-05-15 NOTE — Progress Notes (Unsigned)
 Referring Provider:*** Primary Care Physician:  Duanne Butler DASEN, MD Primary Gastroenterologist:  Dr. PIERRETTE Johns chief complaint on file.   HPI:   Bruce Abbott is a 73 y.o. male presenting today to discuss screening colonoscopy.   Last colonoscopy 06/12/2014 with rectal polyps removed, colonic diverticulosis.  Pathology with hyperplastic polyps.  Recommended 10-year screening exam.   Today:    Past Medical History:  Diagnosis Date   Diabetes mellitus without complication (HCC)    Gout    Hyperlipemia    Hypertension    Neuropathy     Past Surgical History:  Procedure Laterality Date   APPENDECTOMY     CATARACT EXTRACTION W/PHACO Left 09/14/2022   Procedure: CATARACT EXTRACTION PHACO AND INTRAOCULAR LENS PLACEMENT (IOC) with Goinotomy and iAccess;  Surgeon: Harrie Agent, MD;  Location: [facility];  Service: Ophthalmology;  Laterality: Left;  CDE 6.81   CATARACT EXTRACTION W/PHACO Right 09/28/2022   Procedure: CATARACT EXTRACTION PHACO AND INTRAOCULAR LENS PLACEMENT (IOC);  Surgeon: Harrie Agent, MD;  Location: [facility];  Service: Ophthalmology;  Laterality: Right;  CDE: 8.44   CHOLECYSTECTOMY     COLONOSCOPY  2005   Dr. Shaaron: internal hemorrhoids, diminutive rectal polyp, path not available. Left-sided diverticula   COLONOSCOPY N/A 06/12/2014   Procedure: COLONOSCOPY;  Surgeon: Lamar CHRISTELLA Shaaron, MD;  Location: [facility];  Service: Endoscopy;  Laterality: N/A;  8:45 AM rescheduled to 7:30 - Doris to notify pt   GANGLION CYST EXCISION     KNEE ARTHROSCOPY Left    TONSILLECTOMY      Current Outpatient Medications  Medication Sig Dispense Refill   allopurinol (ZYLOPRIM) 300 MG tablet Take 300 mg by mouth daily.     aspirin EC 81 MG tablet Take 81 mg by mouth daily.     clotrimazole-betamethasone (LOTRISONE) cream APPLY TOPICALLY TO AFFECTED AND SURROUNDING AREAS OF SKIN TWICE DAILY IN THE MORNING AND THE EVENING     Coenzyme Q10 (CO Q 10) 100 MG CAPS Take 1 capsule by  mouth daily.     gabapentin  (NEURONTIN ) 300 MG capsule Take 1 capsule (300 mg total) by mouth at bedtime. 30 capsule 0   gemfibrozil (LOPID) 600 MG tablet      lisinopril-hydrochlorothiazide (PRINZIDE,ZESTORETIC) 10-12.5 MG per tablet Take 1 tablet by mouth daily.     metFORMIN (GLUCOPHAGE) 500 MG tablet TAKE 1 TABLET BY MOUTH TWICE DAILY FOR 90 DAYS (Patient taking differently: Take 500 mg by mouth in the morning, at noon, and at bedtime.)     Omega-3 Krill Oil 500 MG CAPS Take 2 capsules by mouth daily.     ONETOUCH ULTRA test strip SMARTSIG:Via Meter     rosuvastatin (CRESTOR) 5 MG tablet Take 5 mg by mouth daily. (Patient not taking: Reported on 05/09/2024)     SHINGRIX injection      No current facility-administered medications for this visit.    Allergies as of 05/17/2024   (No Known Allergies)    Family History  Problem Relation Age of Onset   Hypertension Mother    Heart disease Mother    Leukemia Father    Cancer Maternal Grandmother     Social History   Socioeconomic History   Marital status: Married    Spouse name: Not on file   Number of children: Not on file   Years of education: Not on file   Highest education level: Not on file  Occupational History   Not on file  Tobacco Use  Smoking status: Former    Current packs/day: 0.00    Types: Cigarettes    Quit date: 05/18/2007    Years since quitting: 17.0   Smokeless tobacco: Never   Tobacco comments:    Quit x 7-8 years  Vaping Use   Vaping status: Never Used  Substance and Sexual Activity   Alcohol use: Yes    Comment: occ   Drug use: No   Sexual activity: Not on file  Other Topics Concern   Not on file  Social History Narrative   Not on file   Social Drivers of Health   Financial Resource Strain: Not on file  Food Insecurity: Not on file  Transportation Needs: Not on file  Physical Activity: Not on file  Stress: Not on file  Social Connections: Not on file  Intimate Partner Violence: Not on  file    Review of Systems: Gen: Denies any fever, chills, fatigue, weight loss, lack of appetite.  CV: Denies chest pain, heart palpitations, peripheral edema, syncope.  Resp: Denies shortness of breath at rest or with exertion. Denies wheezing or cough.  GI: Denies dysphagia or odynophagia. Denies jaundice, hematemesis, fecal incontinence. GU : Denies urinary burning, urinary frequency, urinary hesitancy MS: Denies joint pain, muscle weakness, cramps, or limitation of movement.  Derm: Denies rash, itching, dry skin Psych: Denies depression, anxiety, memory loss, and confusion Heme: Denies bruising, bleeding, and enlarged lymph nodes.  Physical Exam: There were no vitals taken for this visit. General:   Alert and oriented. Pleasant and cooperative. Well-nourished and well-developed.  Head:  Normocephalic and atraumatic. Eyes:  Without icterus, sclera clear and conjunctiva pink.  Ears:  Normal auditory acuity. Lungs:  Clear to auscultation bilaterally. No wheezes, rales, or rhonchi. No distress.  Heart:  S1, S2 present without murmurs appreciated.  Abdomen:  +BS, soft, non-tender and non-distended. No HSM noted. No guarding or rebound. No masses appreciated.  Rectal:  Deferred  Msk:  Symmetrical without gross deformities. Normal posture. Extremities:  Without edema. Neurologic:  Alert and  oriented x4;  grossly normal neurologically. Skin:  Intact without significant lesions or rashes. Psych:  Alert and cooperative. Normal mood and affect.    Assessment:     Plan:  ***   Josette Centers, PA-C [facility] 05/17/2024

## 2024-05-17 ENCOUNTER — Encounter: Payer: Self-pay | Admitting: Gastroenterology

## 2024-05-17 ENCOUNTER — Other Ambulatory Visit: Payer: Self-pay | Admitting: *Deleted

## 2024-05-17 ENCOUNTER — Encounter: Payer: Self-pay | Admitting: *Deleted

## 2024-05-17 ENCOUNTER — Ambulatory Visit: Admitting: Gastroenterology

## 2024-05-17 VITALS — BP 117/72 | HR 65 | Temp 97.9°F | Ht 70.0 in | Wt 194.2 lb

## 2024-05-17 DIAGNOSIS — Z1211 Encounter for screening for malignant neoplasm of colon: Secondary | ICD-10-CM

## 2024-05-17 DIAGNOSIS — Z09 Encounter for follow-up examination after completed treatment for conditions other than malignant neoplasm: Secondary | ICD-10-CM | POA: Diagnosis not present

## 2024-05-17 DIAGNOSIS — Z8719 Personal history of other diseases of the digestive system: Secondary | ICD-10-CM | POA: Diagnosis not present

## 2024-05-17 DIAGNOSIS — Z860102 Personal history of hyperplastic colon polyps: Secondary | ICD-10-CM | POA: Diagnosis not present

## 2024-05-17 NOTE — Patient Instructions (Signed)
 We will get you schedule for colonoscopy in the near future with Dr. Shaaron. 1 day prior to your procedure: You will only your morning dose of metformin. Day of procedure: Do not take any metformin prior to your colonoscopy.  Josette Centers, PA-C [facility]

## 2024-05-26 ENCOUNTER — Ambulatory Visit (HOSPITAL_COMMUNITY)
Admission: RE | Admit: 2024-05-26 | Discharge: 2024-05-26 | Disposition: A | Discharge status: Home / Self Care | Payer: Self-pay | Source: Ambulatory Visit | Attending: Family Medicine | Admitting: Family Medicine

## 2024-05-26 DIAGNOSIS — E118 Type 2 diabetes mellitus with unspecified complications: Secondary | ICD-10-CM | POA: Insufficient documentation

## 2024-05-26 DIAGNOSIS — Z1211 Encounter for screening for malignant neoplasm of colon: Secondary | ICD-10-CM | POA: Diagnosis not present

## 2024-05-27 LAB — BASIC METABOLIC PANEL WITH GFR
BUN/Creatinine Ratio: 22 (ref 10–24)
BUN: 28 mg/dL — ABNORMAL HIGH (ref 8–27)
CO2: 16 mmol/L — ABNORMAL LOW (ref 20–29)
Calcium: 9.2 mg/dL (ref 8.6–10.2)
Chloride: 102 mmol/L (ref 96–106)
Creatinine, Ser: 1.27 mg/dL (ref 0.76–1.27)
Glucose: 173 mg/dL — ABNORMAL HIGH (ref 70–99)
Potassium: 4.6 mmol/L (ref 3.5–5.2)
Sodium: 134 mmol/L (ref 134–144)
eGFR: 59 mL/min/1.73 — ABNORMAL LOW (ref >59)

## 2024-05-29 ENCOUNTER — Ambulatory Visit: Payer: Self-pay | Admitting: Family Medicine

## 2024-05-30 ENCOUNTER — Other Ambulatory Visit: Payer: Self-pay

## 2024-05-30 ENCOUNTER — Telehealth: Payer: Self-pay

## 2024-05-30 DIAGNOSIS — R931 Abnormal findings on diagnostic imaging of heart and coronary circulation: Secondary | ICD-10-CM

## 2024-05-30 MED ORDER — EZETIMIBE 10 MG PO TABS: 10.0000 mg | ORAL_TABLET | Freq: Every day | ORAL | 1 refills | Status: DC

## 2024-05-30 MED ORDER — ROSUVASTATIN CALCIUM 10 MG PO TABS: 10.0000 mg | ORAL_TABLET | Freq: Every day | ORAL | 1 refills | Status: DC

## 2024-05-30 NOTE — Telephone Encounter (Signed)
 Copied from CRM [id number]. Topic: General - Other >> May 30, 2024  8:38 AM Berwyn MATSU wrote: Reason for CRM:  Patient called in requesting to speak with [facility] LPN. I transferred call to clinic for further assistance. >> May 30, 2024  1:43 PM Berwyn MATSU wrote: Patient called back requesting to speak with Ronal Bradley. Patient is requesting a call back.  May you please assist.

## 2024-05-30 NOTE — Telephone Encounter (Signed)
 See mychart msg

## 2024-05-31 NOTE — Progress Notes (Signed)
 Sent mychart msg.

## 2024-06-12 ENCOUNTER — Encounter (HOSPITAL_COMMUNITY): Payer: Self-pay | Admitting: Internal Medicine

## 2024-06-12 ENCOUNTER — Ambulatory Visit (HOSPITAL_COMMUNITY)
Admission: RE | Admit: 2024-06-12 | Discharge: 2024-06-12 | Disposition: A | Discharge status: Home / Self Care | Attending: Internal Medicine | Admitting: Internal Medicine

## 2024-06-12 ENCOUNTER — Ambulatory Visit (HOSPITAL_COMMUNITY): Admitting: Certified Registered"

## 2024-06-12 ENCOUNTER — Other Ambulatory Visit: Payer: Self-pay

## 2024-06-12 ENCOUNTER — Encounter (HOSPITAL_COMMUNITY)
Admission: RE | Disposition: A | Discharge status: Home / Self Care | Payer: Self-pay | Source: Home / Self Care | Attending: Internal Medicine

## 2024-06-12 DIAGNOSIS — K5730 Diverticulosis of large intestine without perforation or abscess without bleeding: Secondary | ICD-10-CM | POA: Diagnosis not present

## 2024-06-12 DIAGNOSIS — D122 Benign neoplasm of ascending colon: Secondary | ICD-10-CM | POA: Insufficient documentation

## 2024-06-12 DIAGNOSIS — Z87891 Personal history of nicotine dependence: Secondary | ICD-10-CM | POA: Insufficient documentation

## 2024-06-12 DIAGNOSIS — E1141 Type 2 diabetes mellitus with diabetic mononeuropathy: Secondary | ICD-10-CM | POA: Insufficient documentation

## 2024-06-12 DIAGNOSIS — E785 Hyperlipidemia, unspecified: Secondary | ICD-10-CM | POA: Insufficient documentation

## 2024-06-12 DIAGNOSIS — Z1211 Encounter for screening for malignant neoplasm of colon: Secondary | ICD-10-CM

## 2024-06-12 DIAGNOSIS — Z139 Encounter for screening, unspecified: Secondary | ICD-10-CM | POA: Diagnosis not present

## 2024-06-12 DIAGNOSIS — I10 Essential (primary) hypertension: Secondary | ICD-10-CM | POA: Diagnosis not present

## 2024-06-12 LAB — GLUCOSE, CAPILLARY: Glucose-Capillary: 152 mg/dL — ABNORMAL HIGH (ref 70–99)

## 2024-06-12 SURGERY — COLONOSCOPY
Anesthesia: General

## 2024-06-12 MED ORDER — LACTATED RINGERS IV SOLN: INTRAVENOUS | Status: DC

## 2024-06-12 MED ORDER — PROPOFOL 500 MG/50ML IV EMUL
INTRAVENOUS | Status: DC | PRN
  Administered 2024-06-12: 125 ug/kg/min via INTRAVENOUS
  Administered 2024-06-12 (×2): 80 mg via INTRAVENOUS
  Administered 2024-06-12: 125 ug/kg/min via INTRAVENOUS

## 2024-06-12 MED ORDER — LIDOCAINE 2% (20 MG/ML) 5 ML SYRINGE
INTRAMUSCULAR | Status: DC | PRN
  Administered 2024-06-12 (×2): 60 mg via INTRAVENOUS

## 2024-06-12 MED ORDER — LACTATED RINGERS IV SOLN: INTRAVENOUS | Status: DC | PRN

## 2024-06-12 MED ORDER — EPHEDRINE SULFATE-NACL 50-0.9 MG/10ML-% IV SOSY
PREFILLED_SYRINGE | INTRAVENOUS | Status: DC | PRN
  Administered 2024-06-12 (×2): 5 mg via INTRAVENOUS

## 2024-06-12 NOTE — H&P (Signed)
 @LOGO @   Primary Care Physician:  Duanne Butler DASEN, MD Primary Gastroenterologist:  Dr. Shaaron  Pre-Procedure History & Physical: HPI:  Bruce Abbott is a 73 y.o. male here for  average risk screening colonoscopy.  Reported negative colonoscopy 10 years ago.  Records unavailable.  No bowel symptoms.  No family history.  Past Medical History:  Diagnosis Date   Diabetes mellitus without complication (HCC)    Gout    Hyperlipemia    Hypertension    Neuropathy     Past Surgical History:  Procedure Laterality Date   APPENDECTOMY     CATARACT EXTRACTION W/PHACO Left 09/14/2022   Procedure: CATARACT EXTRACTION PHACO AND INTRAOCULAR LENS PLACEMENT (IOC) with Goinotomy and iAccess;  Surgeon: Harrie Agent, MD;  Location: [facility];  Service: Ophthalmology;  Laterality: Left;  CDE 6.81   CATARACT EXTRACTION W/PHACO Right 09/28/2022   Procedure: CATARACT EXTRACTION PHACO AND INTRAOCULAR LENS PLACEMENT (IOC);  Surgeon: Harrie Agent, MD;  Location: [facility];  Service: Ophthalmology;  Laterality: Right;  CDE: 8.44   CHOLECYSTECTOMY     COLONOSCOPY  2005   Dr. Shaaron: internal hemorrhoids, diminutive rectal polyp, path not available. Left-sided diverticula   COLONOSCOPY N/A 06/12/2014   Procedure: COLONOSCOPY;  Surgeon: Lamar CHRISTELLA Shaaron, MD;  Location: [facility];  Service: Endoscopy;  Laterality: N/A;  8:45 AM rescheduled to 7:30 - Doris to notify pt   GANGLION CYST EXCISION     KNEE ARTHROSCOPY Left    TONSILLECTOMY      Prior to Admission medications   Medication Sig Start Date End Date Taking? Authorizing Provider  allopurinol (ZYLOPRIM) 300 MG tablet Take 300 mg by mouth daily.   Yes [provider]  aspirin EC 81 MG tablet Take 81 mg by mouth daily.   Yes [provider]  Coenzyme Q10 (CO Q 10) 100 MG CAPS Take 1 capsule by mouth daily.   Yes [provider]  ezetimibe  (ZETIA ) 10 MG tablet Take 1 tablet (10 mg total) by mouth daily. 05/30/24  Yes Duanne Butler DASEN, MD  gabapentin  (NEURONTIN ) 300 MG capsule Take 1 capsule (300 mg total) by mouth at bedtime. 03/17/19  Yes Gretel Ozell PARAS, DPM  lisinopril-hydrochlorothiazide (PRINZIDE,ZESTORETIC) 10-12.5 MG per tablet Take 1 tablet by mouth daily.   Yes [provider]  metFORMIN (GLUCOPHAGE) 500 MG tablet TAKE 1 TABLET BY MOUTH TWICE DAILY FOR 90 DAYS Patient taking differently: Take 500 mg by mouth in the morning, at noon, and at bedtime. 02/14/19  Yes [provider]  Omega-3 Krill Oil 500 MG CAPS Take 2 capsules by mouth daily.   Yes [provider]  rosuvastatin  (CRESTOR ) 10 MG tablet Take 1 tablet (10 mg total) by mouth daily. 05/30/24  Yes Duanne Butler DASEN, MD  clotrimazole-betamethasone (LOTRISONE) cream APPLY TOPICALLY TO AFFECTED AND SURROUNDING AREAS OF SKIN TWICE DAILY IN THE MORNING AND THE EVENING 03/13/19   [provider]  ONETOUCH ULTRA test strip SMARTSIG:Via Meter 07/30/20   [provider]    Allergies as of 05/17/2024   (No Known Allergies)    Family History  Problem Relation Age of Onset   Hypertension Mother    Heart disease Mother    Leukemia Father    Cancer Maternal Grandmother     Social History   Socioeconomic History   Marital status: Married    Spouse name: Not on file   Number of children: Not on file   Years of education: Not on file  Highest education level: Not on file  Occupational History   Not on file  Tobacco Use   Smoking status: Former    Current packs/day: 0.00    Types: Cigarettes    Quit date: 05/18/2007    Years since quitting: 17.0   Smokeless tobacco: Never   Tobacco comments:    Quit x 7-8 years  Vaping Use   Vaping status: Never Used  Substance and Sexual Activity   Alcohol use: Yes    Comment: occ   Drug use: No   Sexual activity: Not on file  Other Topics Concern   Not on file  Social History Narrative   Not on file   Social Drivers of Health   Financial Resource Strain: Not on  file  Food Insecurity: Not on file  Transportation Needs: Not on file  Physical Activity: Not on file  Stress: Not on file  Social Connections: Not on file  Intimate Partner Violence: Not on file    Review of Systems: See HPI, otherwise negative ROS  Physical Exam: BP 123/64   Pulse (!) 58   Temp 98.3 F (36.8 C) (Oral)   Resp 20   Ht 5' 10 (1.778 m)   Wt 88 kg   SpO2 95%   BMI 27.84 kg/m  General:   Alert,  Well-developed, well-nourished, pleasant and cooperative in NAD Skin:  Intact without significant lesions or rashes. Lungs:  Clear throughout to auscultation.   No wheezes, crackles, or rhonchi. No acute distress. Heart:  Regular rate and rhythm; no murmurs, clicks, rubs,  or gallops. Abdomen: Non-distended, normal bowel sounds.  Soft and nontender without appreciable mass or hepatosplenomegaly.   Impression/Plan:    73 year old gentleman here for a average risk screening colonoscopy.  The risks, benefits, limitations, alternatives and imponderables have been reviewed with the patient. Questions have been answered. All parties are agreeable.       Notice: This dictation was prepared with Dragon dictation along with smaller phrase technology. Any transcriptional errors that result from this process are unintentional and may not be corrected upon review.

## 2024-06-12 NOTE — Anesthesia Postprocedure Evaluation (Signed)
 Anesthesia Post Note  Patient: Bruce Abbott  Procedure(s) Performed: COLONOSCOPY  Patient location during evaluation: Endoscopy Anesthesia Type: General Level of consciousness: awake and alert Pain management: pain level controlled Vital Signs Assessment: post-procedure vital signs reviewed and stable Respiratory status: spontaneous breathing, nonlabored ventilation and respiratory function stable Cardiovascular status: blood pressure returned to baseline and stable Postop Assessment: no apparent nausea or vomiting Anesthetic complications: no   There were no known notable events for this encounter.   Last Vitals:  Vitals:   06/12/24 1113 06/12/24 1116  BP: (!) 88/51 (!) 102/48  Pulse: 70 69  Resp: 16 14  Temp: 36.4 C   SpO2: 98% 94%    Last Pain:  Vitals:   06/12/24 1113  TempSrc: Oral  PainSc: 0-No pain                 Aydeen Blume L Kjersti Dittmer

## 2024-06-12 NOTE — Anesthesia Postprocedure Evaluation (Signed)
 Anesthesia Post Note  Patient: Bruce Abbott  Procedure(s) Performed: COLONOSCOPY  Patient location during evaluation: Endoscopy Anesthesia Type: General Level of consciousness: awake and alert Pain management: pain level controlled Vital Signs Assessment: post-procedure vital signs reviewed and stable Respiratory status: spontaneous breathing, nonlabored ventilation and respiratory function stable Cardiovascular status: blood pressure returned to baseline and stable Postop Assessment: no apparent nausea or vomiting Anesthetic complications: no   There were no known notable events for this encounter.   Last Vitals:  Vitals:   06/12/24 1113 06/12/24 1116  BP: (!) 88/51 (!) 102/48  Pulse: 70 69  Resp: 16 14  Temp: 36.4 C   SpO2: 98% 94%    Last Pain:  Vitals:   06/12/24 1113  TempSrc: Oral  PainSc: 0-No pain                 Lexis Potenza L Nayely Dingus

## 2024-06-12 NOTE — Discharge Instructions (Signed)
  Colonoscopy Discharge Instructions  Read the instructions outlined below and refer to this sheet in the next few weeks. These discharge instructions provide you with general information on caring for yourself after you leave the hospital. Your doctor may also give you specific instructions. While your treatment has been planned according to the most current medical practices available, unavoidable complications occasionally occur. If you have any problems or questions after discharge, call Dr. Riley Cheadle at [phone]. ACTIVITY You may resume your regular activity, but move at a slower pace for the next 24 hours.  Take frequent rest periods for the next 24 hours.  Walking will help get rid of the air and reduce the bloated feeling in your belly (abdomen).  No driving for 24 hours (because of the medicine (anesthesia) used during the test).   Do not sign any important legal documents or operate any machinery for 24 hours (because of the anesthesia used during the test).  NUTRITION Drink plenty of fluids.  You may resume your normal diet as instructed by your doctor.  Begin with a light meal and progress to your normal diet. Heavy or fried foods are harder to digest and may make you feel sick to your stomach (nauseated).  Avoid alcoholic beverages for 24 hours or as instructed.  MEDICATIONS You may resume your normal medications unless your doctor tells you otherwise.  WHAT YOU CAN EXPECT TODAY Some feelings of bloating in the abdomen.  Passage of more gas than usual.  Spotting of blood in your stool or on the toilet paper.  IF YOU HAD POLYPS REMOVED DURING THE COLONOSCOPY: No aspirin products for 7 days or as instructed.  No alcohol for 7 days or as instructed.  Eat a soft diet for the next 24 hours.  FINDING OUT THE RESULTS OF YOUR TEST Not all test results are available during your visit. If your test results are not back during the visit, make an appointment with your caregiver to find out the  results. Do not assume everything is normal if you have not heard from your caregiver or the medical facility. It is important for you to follow up on all of your test results.  SEEK IMMEDIATE MEDICAL ATTENTION IF: You have more than a spotting of blood in your stool.  Your belly is swollen (abdominal distention).  You are nauseated or vomiting.  You have a temperature over 101.  You have abdominal pain or discomfort that is severe or gets worse throughout the day.      1 polyp found and removed today  Further recommendations to follow pending review of pathology report

## 2024-06-12 NOTE — Op Note (Signed)
 [facility] Patient Name: Bruce Abbott Procedure Date: 06/12/2024 10:37 AM MRN: [id number] Date of Birth: 01-Sep-1951 Attending MD: Lamar Ozell Hollingshead , MD, [id number] CSN: [id number] Age: 73 Admit Type: Outpatient Procedure:                Colonoscopy Indications:              Screening for colorectal malignant neoplasm Providers:                Lamar Ozell Hollingshead, MD, Harlene Lips, [place]                            Wilson, Technician Referring MD:              Medicines:                Propofol  per Anesthesia Complications:            No immediate complications. Estimated Blood Loss:     Estimated blood loss was minimal. Procedure:                Pre-Anesthesia Assessment:                           - Prior to the procedure, a History and Physical                            was performed, and patient medications and                            allergies were reviewed. The patient's tolerance of                            previous anesthesia was also reviewed. The risks                            and benefits of the procedure and the sedation                            options and risks were discussed with the patient.                            All questions were answered, and informed consent                            was obtained. Prior Anticoagulants: The patient has                            taken no anticoagulant or antiplatelet agents. ASA                            Grade Assessment: II - A patient with mild systemic                            disease. After reviewing the risks and benefits,  the patient was deemed in satisfactory condition to                            undergo the procedure.                           After obtaining informed consent, the colonoscope                            was passed under direct vision. Throughout the                            procedure, the patient's blood pressure, pulse, and                             oxygen saturations were monitored continuously. The                            [id number]) scope was introduced through                            the anus and advanced to the the cecum, identified                            by appendiceal orifice and ileocecal valve. The                            colonoscopy was performed without difficulty. The                            patient tolerated the procedure well. The quality                            of the bowel preparation was adequate. The                            ileocecal valve, appendiceal orifice, and rectum                            were photographed. Scope In: 10:59:27 AM Scope Out: 11:09:28 AM Scope Withdrawal Time: 0 hours 5 minutes 22 seconds  Total Procedure Duration: 0 hours 10 minutes 1 second  Findings:      The perianal and digital rectal examinations were normal.      Scattered medium-mouthed diverticula were found in the sigmoid colon and       descending colon.      A 6 mm polyp was found in the ascending colon. The polyp was sessile.       The polyp was removed with a cold snare. Resection and retrieval were       complete. Estimated blood loss was minimal.      The exam was otherwise without abnormality on direct and retroflexion       views. Impression:               - Diverticulosis in the sigmoid colon and in the  descending colon.                           - One 6 mm polyp in the ascending colon, removed                            with a cold snare. Resected and retrieved.                           - The examination was otherwise normal on direct                            and retroflexion views. Moderate Sedation:      Moderate (conscious) sedation was personally administered by the       endoscopist. The following parameters were monitored: oxygen saturation,       heart rate, blood pressure, respiratory rate, EKG, adequacy of pulmonary       ventilation, and response  to care. Recommendation:           - Patient has a contact number available for                            emergencies. The signs and symptoms of potential                            delayed complications were discussed with the                            patient. Return to normal activities tomorrow.                            Written discharge instructions were provided to the                            patient.                           - Advance diet as tolerated.                           - Continue present medications.                           - Repeat colonoscopy date to be determined after                            pending pathology results are reviewed for                            surveillance.                           - Return to GI office (date not yet determined). Procedure Code(s):        --- Professional ---  54614, Colonoscopy, flexible; with removal of                            tumor(s), polyp(s), or other lesion(s) by snare                            technique Diagnosis Code(s):        --- Professional ---                           Z12.11, Encounter for screening for malignant                            neoplasm of colon                           D12.2, Benign neoplasm of ascending colon                           K57.30, Diverticulosis of large intestine without                            perforation or abscess without bleeding CPT copyright 2022 American Medical Association. All rights reserved. The codes documented in this report are preliminary and upon coder review may  be revised to meet current compliance requirements. Lamar HERO. Ananya Mccleese, MD Lamar Ozell Hollingshead, MD 06/12/2024 11:18:01 AM This report has been signed electronically. Number of Addenda: 0

## 2024-06-12 NOTE — Interval H&P Note (Signed)
 History and Physical Interval Note:  06/12/2024 10:44 AM  Bruce Abbott  has presented today for surgery, with the diagnosis of screening.  The various methods of treatment have been discussed with the patient and family. After consideration of risks, benefits and other options for treatment, the patient has consented to  Procedure(s) with comments: COLONOSCOPY (N/A) - 11:15 am, asa 2 as a surgical intervention.  The patient's history has been reviewed, patient examined, no change in status, stable for surgery.  I have reviewed the patient's chart and labs.  Questions were answered to the patient's satisfaction.     Lamar Hollingshead

## 2024-06-12 NOTE — Anesthesia Preprocedure Evaluation (Signed)
 Anesthesia Evaluation  Patient identified by MRN, date of birth, ID band Patient awake    Reviewed: Allergy & Precautions, H&P , NPO status , Patient's Chart, lab work & pertinent test results, reviewed documented beta blocker date and time   Airway Mallampati: II  TM Distance: >3 FB Neck ROM: full    Dental no notable dental hx.    Pulmonary neg pulmonary ROS, former smoker   Pulmonary exam normal breath sounds clear to auscultation       Cardiovascular hypertension, Normal cardiovascular exam Rhythm:regular Rate:Normal     Neuro/Psych neuropathy  negative psych ROS   GI/Hepatic negative GI ROS, Neg liver ROS,,,  Endo/Other  diabetes, Type 2    Renal/GU negative Renal ROS  negative genitourinary   Musculoskeletal   Abdominal   Peds  Hematology negative hematology ROS (+)   Anesthesia Other Findings   Reproductive/Obstetrics negative OB ROS                              Anesthesia Physical Anesthesia Plan  ASA: 3  Anesthesia Plan: General   Post-op Pain Management:    Induction: Intravenous  PONV Risk Score and Plan: Propofol  infusion  Airway Management Planned: Natural Airway and Nasal Cannula  Additional Equipment: None  Intra-op Plan:   Post-operative Plan:   Informed Consent: I have reviewed the patients History and Physical, chart, labs and discussed the procedure including the risks, benefits and alternatives for the proposed anesthesia with the patient or authorized representative who has indicated his/her understanding and acceptance.     Dental Advisory Given  Plan Discussed with: CRNA  Anesthesia Plan Comments:         Anesthesia Quick Evaluation

## 2024-06-12 NOTE — Transfer of Care (Signed)
 Immediate Anesthesia Transfer of Care Note  Patient: Bruce Abbott  Procedure(s) Performed: COLONOSCOPY  Patient Location: PACU  Anesthesia Type:General  Level of Consciousness: awake, alert , oriented, and patient cooperative  Airway & Oxygen Therapy: Patient Spontanous Breathing  Post-op Assessment: Report given to RN, Post -op Vital signs reviewed and stable, and Patient moving all extremities X 4  Post vital signs: Reviewed and stable  Last Vitals:  Vitals Value Taken Time  BP 89/51   Temp 97.6   Pulse 67   Resp 17   SpO2 97     Last Pain:  Vitals:   06/12/24 1054  TempSrc:   PainSc: 2       Patients Stated Pain Goal: 4 (06/12/24 0956)  Complications: No notable events documented.

## 2024-06-13 ENCOUNTER — Encounter (HOSPITAL_COMMUNITY): Payer: Self-pay | Admitting: Internal Medicine

## 2024-06-13 ENCOUNTER — Ambulatory Visit: Payer: Self-pay | Admitting: Internal Medicine

## 2024-06-13 LAB — SURGICAL PATHOLOGY

## 2024-07-10 ENCOUNTER — Other Ambulatory Visit

## 2024-07-10 DIAGNOSIS — E538 Deficiency of other specified B group vitamins: Secondary | ICD-10-CM

## 2024-07-10 DIAGNOSIS — M1A00X0 Idiopathic chronic gout, unspecified site, without tophus (tophi): Secondary | ICD-10-CM | POA: Diagnosis not present

## 2024-07-10 DIAGNOSIS — E1140 Type 2 diabetes mellitus with diabetic neuropathy, unspecified: Secondary | ICD-10-CM

## 2024-07-11 LAB — CBC WITH DIFFERENTIAL/PLATELET
Absolute Lymphocytes: 1788 {cells}/uL (ref 850–3900)
Absolute Monocytes: 696 {cells}/uL (ref 200–950)
Basophils Absolute: 18 {cells}/uL (ref 0–200)
Basophils Relative: 0.3 %
Eosinophils Absolute: 118 {cells}/uL (ref 15–500)
Eosinophils Relative: 2 %
HCT: 37.6 % — ABNORMAL LOW (ref 38.5–50.0)
Hemoglobin: 12.4 g/dL — ABNORMAL LOW (ref 13.2–17.1)
MCH: 34.7 pg — ABNORMAL HIGH (ref 27.0–33.0)
MCHC: 33 g/dL (ref 32.0–36.0)
MCV: 105.3 fL — ABNORMAL HIGH (ref 80.0–100.0)
MPV: 10.1 fL (ref 7.5–12.5)
Monocytes Relative: 11.8 %
Neutro Abs: 3280 {cells}/uL (ref 1500–7800)
Neutrophils Relative %: 55.6 %
Platelets: 165 Thousand/uL (ref 140–400)
RBC: 3.57 Million/uL — ABNORMAL LOW (ref 4.20–5.80)
RDW: 13.2 % (ref 11.0–15.0)
Total Lymphocyte: 30.3 %
WBC: 5.9 Thousand/uL (ref 3.8–10.8)

## 2024-07-11 LAB — COMPREHENSIVE METABOLIC PANEL WITH GFR
AG Ratio: 1.5 (calc) (ref 1.0–2.5)
ALT: 27 U/L (ref 9–46)
AST: 25 U/L (ref 10–35)
Albumin: 4.3 g/dL (ref 3.6–5.1)
Alkaline phosphatase (APISO): 36 U/L (ref 35–144)
BUN: 24 mg/dL (ref 7–25)
CO2: 27 mmol/L (ref 20–32)
Calcium: 9.5 mg/dL (ref 8.6–10.3)
Chloride: 100 mmol/L (ref 98–110)
Creat: 1.12 mg/dL (ref 0.70–1.28)
Globulin: 2.8 g/dL (ref 1.9–3.7)
Glucose, Bld: 146 mg/dL — ABNORMAL HIGH (ref 65–99)
Potassium: 4.6 mmol/L (ref 3.5–5.3)
Sodium: 134 mmol/L — ABNORMAL LOW (ref 135–146)
Total Bilirubin: 0.5 mg/dL (ref 0.2–1.2)
Total Protein: 7.1 g/dL (ref 6.1–8.1)
eGFR: 69 mL/min/1.73m2 (ref >=60)

## 2024-07-11 LAB — LIPID PANEL
Cholesterol: 89 mg/dL (ref <200)
HDL: 43 mg/dL (ref >=40)
LDL Cholesterol (Calc): 26 mg/dL
Non-HDL Cholesterol (Calc): 46 mg/dL (ref <130)
Total CHOL/HDL Ratio: 2.1 (calc) (ref <5.0)
Triglycerides: 112 mg/dL (ref <150)

## 2024-07-11 LAB — VITAMIN B12: Vitamin B-12: 238 pg/mL (ref 200–1100)

## 2024-07-11 LAB — URIC ACID: Uric Acid, Serum: 4.1 mg/dL (ref 4.0–8.0)

## 2024-07-11 LAB — HEMOGLOBIN A1C
Hgb A1c MFr Bld: 6.8 % — ABNORMAL HIGH (ref <5.7)
Mean Plasma Glucose: 148 mg/dL
eAG (mmol/L): 8.2 mmol/L

## 2024-07-13 ENCOUNTER — Ambulatory Visit: Admitting: Family Medicine

## 2024-07-13 ENCOUNTER — Encounter: Payer: Self-pay | Admitting: Family Medicine

## 2024-07-13 ENCOUNTER — Encounter: Payer: Self-pay | Admitting: Cardiovascular Disease

## 2024-07-13 VITALS — BP 126/72 | HR 57 | Temp 97.5°F | Ht 70.0 in | Wt 196.0 lb

## 2024-07-13 DIAGNOSIS — E118 Type 2 diabetes mellitus with unspecified complications: Secondary | ICD-10-CM | POA: Diagnosis not present

## 2024-07-13 DIAGNOSIS — E785 Hyperlipidemia, unspecified: Secondary | ICD-10-CM

## 2024-07-13 MED ORDER — LISINOPRIL-HYDROCHLOROTHIAZIDE 10-12.5 MG PO TABS: 1.0000 | ORAL_TABLET | Freq: Every day | ORAL | 1 refills | Status: DC

## 2024-07-13 MED ORDER — EZETIMIBE 10 MG PO TABS: 10.0000 mg | ORAL_TABLET | Freq: Every day | ORAL | 1 refills | Status: AC

## 2024-07-13 MED ORDER — ALLOPURINOL 300 MG PO TABS: 300.0000 mg | ORAL_TABLET | Freq: Every day | ORAL | 1 refills | Status: AC

## 2024-07-13 MED ORDER — GABAPENTIN 300 MG PO CAPS: 300.0000 mg | ORAL_CAPSULE | Freq: Every day | ORAL | 0 refills | Status: DC

## 2024-07-13 MED ORDER — GABAPENTIN 300 MG PO CAPS: 600.0000 mg | ORAL_CAPSULE | Freq: Two times a day (BID) | ORAL | 11 refills | Status: AC

## 2024-07-13 MED ORDER — CLOTRIMAZOLE-BETAMETHASONE 1-0.05 % EX CREA: TOPICAL_CREAM | Freq: Two times a day (BID) | CUTANEOUS | 1 refills | Status: AC

## 2024-07-13 MED ORDER — METFORMIN HCL 500 MG PO TABS: 500.0000 mg | ORAL_TABLET | Freq: Three times a day (TID) | ORAL | 1 refills | Status: AC

## 2024-07-13 NOTE — Progress Notes (Signed)
 Subjective:    Patient ID: Bruce Abbott, male    DOB: September 28, 1951, 73 y.o.   MRN: [id number]  HPI Patient is a 73 year old Caucasian gentleman here today for follow-up of his diabetes and hyperlipidemia.  When I last saw him, he stated that he could not tolerate statin due to statin help with the.  We obtained a coronary calcium  score which is severely elevated.  He scored in the 99th percentile.  He denies any chest pain or shortness of breath.  He has an appointment scheduled to see cardiology in November.  However he is willing to take a statin.  Since that test, he has been on Crestor  10 mg a day along with Zetia .  His LDL cholesterol is well below 55.  However he is still taking gemfibrozil primarily for diarrhea.  I explained to him about the increased risk of statin induced myopathy and muscle inflammation with these medications.  I recommended that he try to wean off the gemfibrozil due to this risk.  He is also on metformin  but he does not believe metformin  causes diarrhea.  His most recent lab work is listed below Appointment on 07/10/2024  Component Date Value Ref Range Status   Uric Acid, Serum 07/10/2024 4.1  4.0 - 8.0 mg/dL Final   Comment: Therapeutic target for gout patients: <6.0 mg/dL .    Vitamin B-12 07/10/2024 238  200 - 1,100 pg/mL Final   Comment: . Please Note: Although the reference range for vitamin B12 is 313 278 1278 pg/mL, it has been reported that between 5 and 10% of patients with values between 200 and 400 pg/mL may experience neuropsychiatric and hematologic abnormalities due to occult B12 deficiency; less than 1% of patients with values above 400 pg/mL will have symptoms. .    Cholesterol 07/10/2024 89  <200 mg/dL Final   HDL 90/91/7974 43  > OR = 40 mg/dL Final   Triglycerides 90/91/7974 112  <150 mg/dL Final   LDL Cholesterol (Calc) 07/10/2024 26  mg/dL (calc) Final   Comment: Reference range: <100 . Desirable range <100 mg/dL for primary prevention;    <70 mg/dL for patients with CHD or diabetic patients  with > or = 2 CHD risk factors. SABRA LDL-C is now calculated using the Martin-Hopkins  calculation, which is a validated novel method providing  better accuracy than the Friedewald equation in the  estimation of LDL-C.  Gladis APPLETHWAITE et al. SANDREA. 7986;689(80): 2061-2068  (http://education.[facility].com/faq/FAQ164)    Total CHOL/HDL Ratio 07/10/2024 2.1  <4.9 (calc) Final   Non-HDL Cholesterol (Calc) 07/10/2024 46  <130 mg/dL (calc) Final   Comment: For patients with diabetes plus 1 major ASCVD risk  factor, treating to a non-HDL-C goal of <100 mg/dL  (LDL-C of <29 mg/dL) is considered a therapeutic  option.    Glucose, Bld 07/10/2024 146 (H)  65 - 99 mg/dL Final   Comment: .            Fasting reference interval . For someone without known diabetes, a glucose value >125 mg/dL indicates that they may have diabetes and this should be confirmed with a follow-up test. .    BUN 07/10/2024 24  7 - 25 mg/dL Final   Creat 90/91/7974 1.12  0.70 - 1.28 mg/dL Final   eGFR 90/91/7974 69  > OR = 60 mL/min/1.15m2 Final   BUN/Creatinine Ratio 07/10/2024 SEE NOTE:  6 - 22 (calc) Final   Comment:    Not Reported: BUN and Creatinine are within  reference range. .    Sodium 07/10/2024 134 (L)  135 - 146 mmol/L Final   Potassium 07/10/2024 4.6  3.5 - 5.3 mmol/L Final   Chloride 07/10/2024 100  98 - 110 mmol/L Final   CO2 07/10/2024 27  20 - 32 mmol/L Final   Calcium  07/10/2024 9.5  8.6 - 10.3 mg/dL Final   Total Protein 90/91/7974 7.1  6.1 - 8.1 g/dL Final   Albumin 90/91/7974 4.3  3.6 - 5.1 g/dL Final   Globulin 90/91/7974 2.8  1.9 - 3.7 g/dL (calc) Final   AG Ratio 07/10/2024 1.5  1.0 - 2.5 (calc) Final   Total Bilirubin 07/10/2024 0.5  0.2 - 1.2 mg/dL Final   Alkaline phosphatase (APISO) 07/10/2024 36  35 - 144 U/L Final   AST 07/10/2024 25  10 - 35 U/L Final   ALT 07/10/2024 27  9 - 46 U/L Final   WBC 07/10/2024 5.9  3.8 - 10.8  Thousand/uL Final   RBC 07/10/2024 3.57 (L)  4.20 - 5.80 Million/uL Final   Hemoglobin 07/10/2024 12.4 (L)  13.2 - 17.1 g/dL Final   HCT 90/91/7974 37.6 (L)  38.5 - 50.0 % Final   MCV 07/10/2024 105.3 (H)  80.0 - 100.0 fL Final   MCH 07/10/2024 34.7 (H)  27.0 - 33.0 pg Final   MCHC 07/10/2024 33.0  32.0 - 36.0 g/dL Final   Comment: For adults, a slight decrease in the calculated MCHC value (in the range of 30 to 32 g/dL) is most likely not clinically significant; however, it should be interpreted with caution in correlation with other red cell parameters and the patient's clinical condition.    RDW 07/10/2024 13.2  11.0 - 15.0 % Final   Platelets 07/10/2024 165  140 - 400 Thousand/uL Final   MPV 07/10/2024 10.1  7.5 - 12.5 fL Final   Neutro Abs 07/10/2024 3,280  1,500 - 7,800 cells/uL Final   Absolute Lymphocytes 07/10/2024 1,788  850 - 3,900 cells/uL Final   Absolute Monocytes 07/10/2024 696  200 - 950 cells/uL Final   Eosinophils Absolute 07/10/2024 118  15 - 500 cells/uL Final   Basophils Absolute 07/10/2024 18  0 - 200 cells/uL Final   Neutrophils Relative % 07/10/2024 55.6  % Final   Total Lymphocyte 07/10/2024 30.3  % Final   Monocytes Relative 07/10/2024 11.8  % Final   Eosinophils Relative 07/10/2024 2.0  % Final   Basophils Relative 07/10/2024 0.3  % Final   Hgb A1c MFr Bld 07/10/2024 6.8 (H)  <5.7 % Final   Comment: For someone without known diabetes, a hemoglobin A1c value of 6.5% or greater indicates that they may have  diabetes and this should be confirmed with a follow-up  test. . For someone with known diabetes, a value <7% indicates  that their diabetes is well controlled and a value  greater than or equal to 7% indicates suboptimal  control. A1c targets should be individualized based on  duration of diabetes, age, comorbid conditions, and  other considerations. . Currently, no consensus exists regarding use of hemoglobin A1c for diagnosis of diabetes for  children. .    Mean Plasma Glucose 07/10/2024 148  mg/dL Final   eAG (mmol/L) 90/91/7974 8.2  mmol/L Final    Past Medical History:  Diagnosis Date   Diabetes mellitus without complication (HCC)    Gout    Hyperlipemia    Hypertension    Neuropathy    Past Surgical History:  Procedure Laterality Date  APPENDECTOMY     CATARACT EXTRACTION W/PHACO Left 09/14/2022   Procedure: CATARACT EXTRACTION PHACO AND INTRAOCULAR LENS PLACEMENT (IOC) with Goinotomy and iAccess;  Surgeon: Harrie Agent, MD;  Location: [facility];  Service: Ophthalmology;  Laterality: Left;  CDE 6.81   CATARACT EXTRACTION W/PHACO Right 09/28/2022   Procedure: CATARACT EXTRACTION PHACO AND INTRAOCULAR LENS PLACEMENT (IOC);  Surgeon: Harrie Agent, MD;  Location: [facility];  Service: Ophthalmology;  Laterality: Right;  CDE: 8.44   CHOLECYSTECTOMY     COLONOSCOPY  2005   Dr. Shaaron: internal hemorrhoids, diminutive rectal polyp, path not available. Left-sided diverticula   COLONOSCOPY N/A 06/12/2014   Procedure: COLONOSCOPY;  Surgeon: Lamar CHRISTELLA Shaaron, MD;  Location: [facility];  Service: Endoscopy;  Laterality: N/A;  8:45 AM rescheduled to 7:30 - Doris to notify pt   COLONOSCOPY N/A 06/12/2024   Procedure: COLONOSCOPY;  Surgeon: Shaaron Lamar CHRISTELLA, MD;  Location: [facility];  Service: Endoscopy;  Laterality: N/A;  11:15 am, asa 2   GANGLION CYST EXCISION     KNEE ARTHROSCOPY Left    TONSILLECTOMY     Current Outpatient Medications on File Prior to Visit  Medication Sig Dispense Refill   aspirin EC 81 MG tablet Take 81 mg by mouth daily.     Coenzyme Q10 (CO Q 10) 100 MG CAPS Take 1 capsule by mouth daily.     indomethacin (INDOCIN) 25 MG capsule Take 25 mg by mouth 3 (three) times daily as needed.     Omega-3 Krill Oil 500 MG CAPS Take 2 capsules by mouth daily.     ONETOUCH ULTRA test strip SMARTSIG:Via Meter     rosuvastatin  (CRESTOR ) 10 MG tablet Take 1 tablet (10 mg total) by mouth daily. 90 tablet 1   No  current facility-administered medications on file prior to visit.   No Known Allergies Social History   Socioeconomic History   Marital status: Married    Spouse name: Not on file   Number of children: Not on file   Years of education: Not on file   Highest education level: Some college, no degree  Occupational History   Not on file  Tobacco Use   Smoking status: Former    Current packs/day: 0.00    Types: Cigarettes    Quit date: 05/18/2007    Years since quitting: 17.1   Smokeless tobacco: Never   Tobacco comments:    Quit x 7-8 years  Vaping Use   Vaping status: Never Used  Substance and Sexual Activity   Alcohol use: Yes    Comment: occ   Drug use: No   Sexual activity: Not on file  Other Topics Concern   Not on file  Social History Narrative   Not on file   Social Drivers of Health   Financial Resource Strain: Not on file  Food Insecurity: Unknown (07/09/2024)   Hunger Vital Sign    Worried About Running Out of Food in the Last Year: Not on file    Ran Out of Food in the Last Year: Never true  Transportation Needs: No Transportation Needs (07/09/2024)   PRAPARE - Administrator, Civil Service (Medical): No    Lack of Transportation (Non-Medical): No  Physical Activity: Insufficiently Active (07/09/2024)   Exercise Vital Sign    Days of Exercise per Week: 3 days    Minutes of Exercise per Session: 20 min  Stress: No Stress Concern Present (07/09/2024)   [employer] of Occupational Health -  Occupational Stress Questionnaire    Feeling of Stress: Not at all  Social Connections: Unknown (07/09/2024)   Social Connection and Isolation Panel    Frequency of Communication with Friends and Family: Not on file    Frequency of Social Gatherings with Friends and Family: Once a week    Attends Religious Services: More than 4 times per year    Active Member of [employer] or Organizations: Not on file    Attends Banker Meetings: Not on file    Marital  Status: Married  Catering manager Violence: Not on file   Family History  Problem Relation Age of Onset   Hypertension Mother    Heart disease Mother    Leukemia Father    Cancer Maternal Grandmother       Review of Systems  All other systems reviewed and are negative.      Objective:   Physical Exam Vitals reviewed.  Constitutional:      General: He is not in acute distress.    Appearance: Normal appearance. He is normal weight. He is not ill-appearing or toxic-appearing.  HENT:     Head: Normocephalic and atraumatic.     Right Ear: Tympanic membrane and ear canal normal.     Left Ear: Tympanic membrane and ear canal normal.     Nose: Nose normal. No congestion or rhinorrhea.     Mouth/Throat:     Mouth: Mucous membranes are moist.     Pharynx: Oropharynx is clear. No oropharyngeal exudate or posterior oropharyngeal erythema.  Eyes:     General:        Right eye: No discharge.        Left eye: No discharge.     Extraocular Movements: Extraocular movements intact.     Conjunctiva/sclera: Conjunctivae normal.     Pupils: Pupils are equal, round, and reactive to light.  Neck:     Vascular: No carotid bruit.  Cardiovascular:     Rate and Rhythm: Normal rate and regular rhythm.     Pulses: Normal pulses.     Heart sounds: Normal heart sounds. No murmur heard.    No gallop.  Pulmonary:     Effort: Pulmonary effort is normal. No respiratory distress.     Breath sounds: Normal breath sounds. No stridor. No wheezing, rhonchi or rales.  Abdominal:     General: Bowel sounds are normal. There is no distension.     Palpations: There is no mass.     Tenderness: There is no abdominal tenderness. There is no guarding or rebound.     Hernia: No hernia is present.  Musculoskeletal:     Cervical back: Normal range of motion and neck supple.     Right lower leg: No edema.     Left lower leg: No edema.  Lymphadenopathy:     Cervical: No cervical adenopathy.  Skin:    General:  Skin is warm.     Coloration: Skin is not jaundiced.     Findings: No bruising, erythema, lesion or rash.  Neurological:     General: No focal deficit present.     Mental Status: He is alert and oriented to person, place, and time. Mental status is at baseline.     Cranial Nerves: No cranial nerve deficit.     Motor: No weakness.     Coordination: Coordination normal.     Gait: Gait normal.     Deep Tendon Reflexes: Reflexes normal.  Psychiatric:  Mood and Affect: Mood normal.        Behavior: Behavior normal.           Assessment & Plan:  Controlled type 2 diabetes mellitus with complication, without long-term current use of insulin (HCC)  Dyslipidemia Diabetes and blood pressure are well-controlled.  Cholesterol is excellent.  I would like the patient on a higher dose statin.  However he has a history of statin induced myopathy so we will continue Crestor  10 mg at the present time.  Continue Zetia .  I recommended that he wean off the gemfibrozil and try to stop that medication due to the risk of myalgias and myopathy.  He can also increase gabapentin  gradually to 600 mg twice daily for neuropathy.  Follow-up with cardiology as planned and recheck lab work in 4 months

## 2024-07-18 ENCOUNTER — Ambulatory Visit: Attending: Cardiovascular Disease | Admitting: Cardiovascular Disease

## 2024-07-18 ENCOUNTER — Encounter: Payer: Self-pay | Admitting: Cardiovascular Disease

## 2024-07-18 VITALS — BP 110/58 | HR 60 | Ht 70.0 in | Wt 199.0 lb

## 2024-07-18 DIAGNOSIS — E785 Hyperlipidemia, unspecified: Secondary | ICD-10-CM | POA: Insufficient documentation

## 2024-07-18 DIAGNOSIS — R931 Abnormal findings on diagnostic imaging of heart and coronary circulation: Secondary | ICD-10-CM | POA: Insufficient documentation

## 2024-07-18 DIAGNOSIS — I10 Essential (primary) hypertension: Secondary | ICD-10-CM | POA: Insufficient documentation

## 2024-07-18 DIAGNOSIS — E782 Mixed hyperlipidemia: Secondary | ICD-10-CM

## 2024-07-18 NOTE — Assessment & Plan Note (Signed)
 History of essential hypertension blood pressure measured today at 110/58.  He is on Zestoretic .

## 2024-07-18 NOTE — Assessment & Plan Note (Signed)
 History of hyperlipidemia on statin therapy and Zetia  with lipid profile performed 07/10/2024 revealing a total cholesterol 89, LDL 26 and HDL 43.

## 2024-07-18 NOTE — Patient Instructions (Signed)
 Medication Instructions:  Your physician recommends that you continue on your current medications as directed. Please refer to the Current Medication list given to you today.  *If you need a refill on your cardiac medications before your next appointment, please call your pharmacy*    Testing/Procedures: See below   Follow-Up: At [facility], you and your health needs are our priority.  As part of our continuing mission to provide you with exceptional heart care, our providers are all part of one team.  This team includes your primary Cardiologist (physician) and Advanced Practice Providers or APPs (Physician Assistants and Nurse Practitioners) who all work together to provide you with the care you need, when you need it.  Your next appointment:   12 month(s)  Provider:   Dorn Lesches, MD    We recommend signing up for the patient portal called MyChart.  Sign up information is provided on this After Visit Summary.  MyChart is used to connect with patients for Virtual Visits (Telemedicine).  Patients are able to view lab/test results, encounter notes, upcoming appointments, etc.  Non-urgent messages can be sent to your provider as well.   To learn more about what you can do with MyChart, go to [email or web address].   Other Instructions    Please report to Radiology at the [facility] Main Entrance 30 minutes early for your test.  [address] [place], [place] [address]                         OR   Please report to Radiology at [facility] Main Entrance, medical mall, 30 mins prior to your test.  [address]  [place], [place]  How to Prepare for Your Cardiac PET/CT Stress Test:  Nothing to eat or drink, except water, 3 hours prior to arrival time.  NO caffeine/decaffeinated products, or chocolate 12 hours prior to arrival. (Please note decaffeinated beverages (teas/coffees) still contain caffeine).  If you have caffeine  within 12 hours prior, the test will need to be rescheduled.  Medication instructions: Do not take erectile dysfunction medications for 72 hours prior to test (sildenafil, tadalafil) Do not take nitrates (isosorbide mononitrate, Ranexa) the day before or day of test Do not take tamsulosin the day before or morning of test Hold theophylline containing medications for 12 hours. Hold Dipyridamole 48 hours prior to the test.  Diabetic Preparation: If able to eat breakfast prior to 3 hour fasting, you may take all medications, including your insulin . Do not worry if you miss your breakfast dose of insulin  - start at your next meal. If you do not eat prior to 3 hour fast-Hold all diabetes (oral and insulin ) medications. Patients who wear a continuous glucose monitor MUST remove the device prior to scanning.  You may take your remaining medications with water.  NO perfume, cologne or lotion on chest or abdomen area. FEMALES - Please avoid wearing dresses to this appointment.  Total time is 1 to 2 hours; you may want to bring reading material for the waiting time.  IF YOU THINK YOU MAY BE PREGNANT, OR ARE NURSING PLEASE INFORM THE TECHNOLOGIST.  In preparation for your appointment, medication and supplies will be purchased.  Appointment availability is limited, so if you need to cancel or reschedule, please call the Radiology Department Scheduler at [phone] 24 hours in advance to avoid a cancellation fee of $100.00  What to Expect When you Arrive:  Once  you arrive and check in for your appointment, you will be taken to a preparation room within the Radiology Department.  A technologist or Nurse will obtain your medical history, verify that you are correctly prepped for the exam, and explain the procedure.  Afterwards, an IV will be started in your arm and electrodes will be placed on your skin for EKG monitoring during the stress portion of the exam. Then you will be escorted to the PET/CT  scanner.  There, staff will get you positioned on the scanner and obtain a blood pressure and EKG.  During the exam, you will continue to be connected to the EKG and blood pressure machines.  A small, safe amount of a radioactive tracer will be injected in your IV to obtain a series of pictures of your heart along with an injection of a stress agent.    After your Exam:  It is recommended that you eat a meal and drink a caffeinated beverage to counter act any effects of the stress agent.  Drink plenty of fluids for the remainder of the day and urinate frequently for the first couple of hours after the exam.  Your doctor will inform you of your test results within 7-10 business days.  For more information and frequently asked questions, please visit our website: [email or web address]  For questions about your test or how to prepare for your test, please call: Cardiac Imaging Nurse Navigators Office: [phone]

## 2024-07-18 NOTE — Progress Notes (Signed)
 07/18/2024 [name]   02-15-1951  [id number]  Primary Physician Duanne Butler DASEN, MD Primary Cardiologist: Dorn JINNY Lesches MD [facility] [place], [place]  HPI:  Bruce Abbott is a 73 y.o. thin-appearing married Caucasian male father of 2 children, grandfather of 3 grandchildren who is accompanied by his wife Darice.  He is retired from being in Geographical information systems officer.  He is referred by his primary care provider, Dr. Butler Duanne, for cardiac evaluation because of an elevated coronary calcium  score.  He did see Dr. Peter [place] remotely 05/17/2017 for hyperlipidemia.  His risk factors include treated hypertension, diabetes and hyperlipidemia.  He does have a remote history of tobacco abuse having smoked 45 pack years and quit 15 years ago.  His mother apparently had a stent.  He is never had a heart attack or stroke.  He denies chest pain but does have some mild dyspnea.  He does do some gardening work and saws wood without symptoms.  A coronary calcium  score performed by his PCP 05/26/2024 was 3495 distributed in all 3 coronary arteries.   Current Meds  Medication Sig   allopurinol  (ZYLOPRIM ) 300 MG tablet Take 1 tablet (300 mg total) by mouth daily.   aspirin EC 81 MG tablet Take 81 mg by mouth daily.   clotrimazole -betamethasone  (LOTRISONE ) cream Apply topically 2 (two) times daily.   Coenzyme Q10 (CO Q 10) 100 MG CAPS Take 1 capsule by mouth daily.   ezetimibe  (ZETIA ) 10 MG tablet Take 1 tablet (10 mg total) by mouth daily.   gabapentin  (NEURONTIN ) 300 MG capsule Take 2 capsules (600 mg total) by mouth 2 (two) times daily.   indomethacin (INDOCIN) 25 MG capsule Take 25 mg by mouth 3 (three) times daily as needed.   lisinopril -hydrochlorothiazide  (ZESTORETIC ) 10-12.5 MG tablet Take 1 tablet by mouth daily.   metFORMIN  (GLUCOPHAGE ) 500 MG tablet Take 1 tablet (500 mg total) by mouth in the morning, at noon, and at bedtime.   Omega-3 Krill Oil 500 MG CAPS Take 2 capsules by mouth daily.    ONETOUCH ULTRA test strip SMARTSIG:Via Meter   rosuvastatin  (CRESTOR ) 10 MG tablet Take 1 tablet (10 mg total) by mouth daily.     No Known Allergies  Social History   Socioeconomic History   Marital status: Married    Spouse name: Not on file   Number of children: Not on file   Years of education: Not on file   Highest education level: Some college, no degree  Occupational History   Not on file  Tobacco Use   Smoking status: Former    Current packs/day: 0.00    Types: Cigarettes    Quit date: 05/18/2007    Years since quitting: 17.1   Smokeless tobacco: Never   Tobacco comments:    Quit x 7-8 years  Vaping Use   Vaping status: Never Used  Substance and Sexual Activity   Alcohol use: Yes    Comment: occ   Drug use: No   Sexual activity: Not on file  Other Topics Concern   Not on file  Social History Narrative   Not on file   Social Drivers of Health   Financial Resource Strain: Not on file  Food Insecurity: Unknown (07/09/2024)   Hunger Vital Sign    Worried About Running Out of Food in the Last Year: Not on file    Ran Out of Food in the Last Year: Never true  Transportation Needs: No Transportation Needs (  07/09/2024)   PRAPARE - Administrator, Civil Service (Medical): No    Lack of Transportation (Non-Medical): No  Physical Activity: Insufficiently Active (07/09/2024)   Exercise Vital Sign    Days of Exercise per Week: 3 days    Minutes of Exercise per Session: 20 min  Stress: No Stress Concern Present (07/09/2024)   [employer] of Occupational Health - Occupational Stress Questionnaire    Feeling of Stress: Not at all  Social Connections: Unknown (07/09/2024)   Social Connection and Isolation Panel    Frequency of Communication with Friends and Family: Not on file    Frequency of Social Gatherings with Friends and Family: Once a week    Attends Religious Services: More than 4 times per year    Active Member of [employer] or Organizations: Not on file     Attends Banker Meetings: Not on file    Marital Status: Married  Catering manager Violence: Not on file     Review of Systems: General: negative for chills, fever, night sweats or weight changes.  Cardiovascular: negative for chest pain, dyspnea on exertion, edema, orthopnea, palpitations, paroxysmal nocturnal dyspnea or shortness of breath Dermatological: negative for rash Respiratory: negative for cough or wheezing Urologic: negative for hematuria Abdominal: negative for nausea, vomiting, diarrhea, bright red blood per rectum, melena, or hematemesis Neurologic: negative for visual changes, syncope, or dizziness All other systems reviewed and are otherwise negative except as noted above.    Blood pressure (!) 110/58, pulse 60, height 5' 10 (1.778 m), weight 199 lb (90.3 kg), SpO2 97%.  General appearance: alert and no distress Neck: no adenopathy, no carotid bruit, no JVD, supple, symmetrical, trachea midline, and thyroid  not enlarged, symmetric, no tenderness/mass/nodules Lungs: clear to auscultation bilaterally Heart: regular rate and rhythm, S1, S2 normal, no murmur, click, rub or gallop Extremities: extremities normal, atraumatic, no cyanosis or edema Pulses: 2+ and symmetric Skin: Skin color, texture, turgor normal. No rashes or lesions Neurologic: Grossly normal  EKG EKG Interpretation Date/Time:  Tuesday July 18 2024 08:29:00 EDT Ventricular Rate:  60 PR Interval:  186 QRS Duration:  84 QT Interval:  436 QTC Calculation: 436 R Axis:   -11  Text Interpretation: Normal sinus rhythm Normal ECG No previous ECGs available Confirmed by Court Carrier [id number]) on 07/18/2024 8:34:31 AM    ASSESSMENT AND PLAN:   Essential hypertension History of essential hypertension blood pressure measured today at 110/58.  He is on Zestoretic .  Hyperlipidemia History of hyperlipidemia on statin therapy and Zetia  with lipid profile performed 07/10/2024 revealing a  total cholesterol 89, LDL 26 and HDL 43.  Elevated coronary artery calcium  score History of elevated coronary calcium  score performed 05/26/2024 of 3495 distributed in all 3 coronary arteries.  He is completely asymptomatic.  He is at goal for secondary prevention.  I am going to get a cardiac PET study to further evaluate.     Carrier DOROTHA Court MD FACP,FACC,FAHA, [facility] 07/18/2024 8:47 AM

## 2024-07-18 NOTE — Assessment & Plan Note (Signed)
 History of elevated coronary calcium  score performed 05/26/2024 of 3495 distributed in all 3 coronary arteries.  He is completely asymptomatic.  He is at goal for secondary prevention.  I am going to get a cardiac PET study to further evaluate.

## 2024-07-19 DIAGNOSIS — H4089 Other specified glaucoma: Secondary | ICD-10-CM | POA: Diagnosis not present

## 2024-07-19 DIAGNOSIS — H01002 Unspecified blepharitis right lower eyelid: Secondary | ICD-10-CM | POA: Diagnosis not present

## 2024-07-19 DIAGNOSIS — H01001 Unspecified blepharitis right upper eyelid: Secondary | ICD-10-CM | POA: Diagnosis not present

## 2024-07-19 DIAGNOSIS — H01004 Unspecified blepharitis left upper eyelid: Secondary | ICD-10-CM | POA: Diagnosis not present

## 2024-07-25 ENCOUNTER — Encounter (HOSPITAL_COMMUNITY): Payer: Self-pay

## 2024-07-26 ENCOUNTER — Encounter (HOSPITAL_COMMUNITY)
Admission: RE | Admit: 2024-07-26 | Discharge: 2024-07-26 | Disposition: A | Discharge status: Home / Self Care | Source: Ambulatory Visit | Attending: Cardiovascular Disease | Admitting: Cardiovascular Disease

## 2024-07-26 DIAGNOSIS — I2584 Coronary atherosclerosis due to calcified coronary lesion: Secondary | ICD-10-CM | POA: Insufficient documentation

## 2024-07-26 DIAGNOSIS — E782 Mixed hyperlipidemia: Secondary | ICD-10-CM | POA: Insufficient documentation

## 2024-07-26 DIAGNOSIS — I10 Essential (primary) hypertension: Secondary | ICD-10-CM | POA: Insufficient documentation

## 2024-07-26 DIAGNOSIS — I700 Atherosclerosis of aorta: Secondary | ICD-10-CM | POA: Insufficient documentation

## 2024-07-26 DIAGNOSIS — Z136 Encounter for screening for cardiovascular disorders: Secondary | ICD-10-CM | POA: Insufficient documentation

## 2024-07-26 DIAGNOSIS — R931 Abnormal findings on diagnostic imaging of heart and coronary circulation: Secondary | ICD-10-CM | POA: Diagnosis not present

## 2024-07-26 DIAGNOSIS — I2510 Atherosclerotic heart disease of native coronary artery without angina pectoris: Secondary | ICD-10-CM

## 2024-07-26 MED ORDER — REGADENOSON 0.4 MG/5ML IV SOLN
0.4000 mg | Freq: Once | INTRAVENOUS | Status: AC
Start: 2024-07-26 — End: 2024-07-26
  Administered 2024-07-26: 0.4 mg via INTRAVENOUS

## 2024-07-26 MED ORDER — RUBIDIUM RB82 GENERATOR (RUBYFILL)
23.5000 | PACK | Freq: Once | INTRAVENOUS | Status: AC
  Administered 2024-07-26: 22.83 via INTRAVENOUS

## 2024-07-26 MED ORDER — RUBIDIUM RB82 GENERATOR (RUBYFILL)
23.5000 | PACK | Freq: Once | INTRAVENOUS | Status: AC
  Administered 2024-07-26: 22.8 via INTRAVENOUS

## 2024-07-26 MED ORDER — REGADENOSON 0.4 MG/5ML IV SOLN
INTRAVENOUS | Status: AC
  Filled 2024-07-26: qty 5

## 2024-07-27 ENCOUNTER — Institutional Professional Consult (permissible substitution) (HOSPITAL_BASED_OUTPATIENT_CLINIC_OR_DEPARTMENT_OTHER): Admitting: Nurse Practitioner

## 2024-07-27 LAB — NM PET CT CARDIAC PERFUSION MULTI W/ABSOLUTE BLOODFLOW
MBFR: 1.69
Rest MBF: 0.65 ml/g/min
Rest Nuclear Isotope Dose: 22.8 mCi
ST Depression (mm): 0 mm
Stress MBF: 1.1 ml/g/min
Stress Nuclear Isotope Dose: 22.8 mCi

## 2024-07-28 ENCOUNTER — Ambulatory Visit: Payer: Self-pay | Admitting: Cardiovascular Disease

## 2024-08-23 ENCOUNTER — Ambulatory Visit (INDEPENDENT_AMBULATORY_CARE_PROVIDER_SITE_OTHER)

## 2024-08-23 DIAGNOSIS — Z23 Encounter for immunization: Secondary | ICD-10-CM

## 2024-08-23 DIAGNOSIS — E538 Deficiency of other specified B group vitamins: Secondary | ICD-10-CM

## 2024-08-23 MED ORDER — CYANOCOBALAMIN 1000 MCG/ML IJ SOLN
1000.0000 ug | Freq: Once | INTRAMUSCULAR | Status: AC
  Administered 2024-08-23: 1000 ug via INTRAMUSCULAR

## 2024-08-23 NOTE — Addendum Note (Signed)
 Addended by: CORINNA JESUSA SAUNDERS on: 08/23/2024 03:36 PM   Modules accepted: Orders

## 2024-08-23 NOTE — Progress Notes (Signed)
 Patient is in office today for a nurse visit for B12 Injection. Patient Injection was given in the  Right deltoid. Patient tolerated injection well.  Patient is in office today for a nurse visit for a high dose flu immunization. Injection was given in the Left deltoid. Patient tolerated injection well.

## 2024-08-31 ENCOUNTER — Ambulatory Visit: Admitting: *Deleted

## 2024-08-31 VITALS — Ht 70.0 in | Wt 199.0 lb

## 2024-08-31 DIAGNOSIS — Z0000 Encounter for general adult medical examination without abnormal findings: Secondary | ICD-10-CM | POA: Diagnosis not present

## 2024-08-31 NOTE — Patient Instructions (Signed)
 [name] , Thank you for taking time to come for your Medicare Wellness Visit. I appreciate your ongoing commitment to your health goals. Please review the following plan we discussed and let me know if I can assist you in the future.   Screening recommendations/referrals: Colonoscopy:  Recommended yearly ophthalmology/optometry visit for glaucoma screening and checkup Recommended yearly dental visit for hygiene and checkup  Vaccinations: Influenza vaccine:  Pneumococcal vaccine:  Tdap vaccine:  Shingles vaccine:        Preventive Care 65 Years and Older, Male Preventive care refers to lifestyle choices and visits with your health care provider that can promote health and wellness. What does preventive care include? A yearly physical exam. This is also called an annual well check. Dental exams once or twice a year. Routine eye exams. Ask your health care provider how often you should have your eyes checked. Personal lifestyle choices, including: Daily care of your teeth and gums. Regular physical activity. Eating a healthy diet. Avoiding tobacco and drug use. Limiting alcohol use. Practicing safe sex. Taking low doses of aspirin every day. Taking vitamin and mineral supplements as recommended by your health care provider. What happens during an annual well check? The services and screenings done by your health care provider during your annual well check will depend on your age, overall health, lifestyle risk factors, and family history of disease. Counseling  Your health care provider may ask you questions about your: Alcohol use. Tobacco use. Drug use. Emotional well-being. Home and relationship well-being. Sexual activity. Eating habits. History of falls. Memory and ability to understand (cognition). Work and work Astronomer. Screening  You may have the following tests or measurements: Height, weight, and BMI. Blood pressure. Lipid and cholesterol levels. These  may be checked every 5 years, or more frequently if you are over 3 years old. Skin check. Lung cancer screening. You may have this screening every year starting at age 12 if you have a 30-pack-year history of smoking and currently smoke or have quit within the past 15 years. Fecal occult blood test (FOBT) of the stool. You may have this test every year starting at age 92. Flexible sigmoidoscopy or colonoscopy. You may have a sigmoidoscopy every 5 years or a colonoscopy every 10 years starting at age 39. Prostate cancer screening. Recommendations will vary depending on your family history and other risks. Hepatitis C blood test. Hepatitis B blood test. Sexually transmitted disease (STD) testing. Diabetes screening. This is done by checking your blood sugar (glucose) after you have not eaten for a while (fasting). You may have this done every 1-3 years. Abdominal aortic aneurysm (AAA) screening. You may need this if you are a current or former smoker. Osteoporosis. You may be screened starting at age 4 if you are at high risk. Talk with your health care provider about your test results, treatment options, and if necessary, the need for more tests. Vaccines  Your health care provider may recommend certain vaccines, such as: Influenza vaccine. This is recommended every year. Tetanus, diphtheria, and acellular pertussis (Tdap, Td) vaccine. You may need a Td booster every 10 years. Zoster vaccine. You may need this after age 41. Pneumococcal 13-valent conjugate (PCV13) vaccine. One dose is recommended after age 14. Pneumococcal polysaccharide (PPSV23) vaccine. One dose is recommended after age 101. Talk to your health care provider about which screenings and vaccines you need and how often you need them. This information is not intended to replace advice given to you by your health  care provider. Make sure you discuss any questions you have with your health care provider. Document Released:  11/15/2015 Document Revised: 07/08/2016 Document Reviewed: 08/20/2015 Elsevier Interactive Patient Education  2017 [employer].  Fall Prevention in the Home Falls can cause injuries. They can happen to people of all ages. There are many things you can do to make your home safe and to help prevent falls. What can I do on the outside of my home? Regularly fix the edges of walkways and driveways and fix any cracks. Remove anything that might make you trip as you walk through a door, such as a raised step or threshold. Trim any bushes or trees on the path to your home. Use bright outdoor lighting. Clear any walking paths of anything that might make someone trip, such as rocks or tools. Regularly check to see if handrails are loose or broken. Make sure that both sides of any steps have handrails. Any raised decks and porches should have guardrails on the edges. Have any leaves, snow, or ice cleared regularly. Use sand or salt on walking paths during winter. Clean up any spills in your garage right away. This includes oil or grease spills. What can I do in the bathroom? Use night lights. Install grab bars by the toilet and in the tub and shower. Do not use towel bars as grab bars. Use non-skid mats or decals in the tub or shower. If you need to sit down in the shower, use a plastic, non-slip stool. Keep the floor dry. Clean up any water  that spills on the floor as soon as it happens. Remove soap buildup in the tub or shower regularly. Attach bath mats securely with double-sided non-slip rug tape. Do not have throw rugs and other things on the floor that can make you trip. What can I do in the bedroom? Use night lights. Make sure that you have a light by your bed that is easy to reach. Do not use any sheets or blankets that are too big for your bed. They should not hang down onto the floor. Have a firm chair that has side arms. You can use this for support while you get dressed. Do not have  throw rugs and other things on the floor that can make you trip. What can I do in the kitchen? Clean up any spills right away. Avoid walking on wet floors. Keep items that you use a lot in easy-to-reach places. If you need to reach something above you, use a strong step stool that has a grab bar. Keep electrical cords out of the way. Do not use floor polish or wax that makes floors slippery. If you must use wax, use non-skid floor wax. Do not have throw rugs and other things on the floor that can make you trip. What can I do with my stairs? Do not leave any items on the stairs. Make sure that there are handrails on both sides of the stairs and use them. Fix handrails that are broken or loose. Make sure that handrails are as long as the stairways. Check any carpeting to make sure that it is firmly attached to the stairs. Fix any carpet that is loose or worn. Avoid having throw rugs at the top or bottom of the stairs. If you do have throw rugs, attach them to the floor with carpet tape. Make sure that you have a light switch at the top of the stairs and the bottom of the stairs. If you do not  have them, ask someone to add them for you. What else can I do to help prevent falls? Wear shoes that: Do not have high heels. Have rubber bottoms. Are comfortable and fit you well. Are closed at the toe. Do not wear sandals. If you use a stepladder: Make sure that it is fully opened. Do not climb a closed stepladder. Make sure that both sides of the stepladder are locked into place. Ask someone to hold it for you, if possible. Clearly mark and make sure that you can see: Any grab bars or handrails. First and last steps. Where the edge of each step is. Use tools that help you move around (mobility aids) if they are needed. These include: Canes. Walkers. Scooters. Crutches. Turn on the lights when you go into a dark area. Replace any light bulbs as soon as they burn out. Set up your furniture so  you have a clear path. Avoid moving your furniture around. If any of your floors are uneven, fix them. If there are any pets around you, be aware of where they are. Review your medicines with your doctor. Some medicines can make you feel dizzy. This can increase your chance of falling. Ask your doctor what other things that you can do to help prevent falls. This information is not intended to replace advice given to you by your health care provider. Make sure you discuss any questions you have with your health care provider. Document Released: 08/15/2009 Document Revised: 03/26/2016 Document Reviewed: 11/23/2014 Elsevier Interactive Patient Education  2017 [employer].

## 2024-08-31 NOTE — Progress Notes (Signed)
 Subjective:   Bruce Abbott is a 73 y.o. male who presents for Medicare Annual/Subsequent preventive examination.  Visit Complete: Virtual I connected with  Bruce Abbott on 08/31/24 by a audio enabled telemedicine application and verified that I am speaking with the correct person using two identifiers.  Patient Location: Home  Provider Location: Home Office  I discussed the limitations of evaluation and management by telemedicine. The patient expressed understanding and agreed to proceed.  Vital Signs: Because this visit was a virtual/telehealth visit, some criteria may be missing or patient reported. Any vitals not documented were not able to be obtained and vitals that have been documented are patient reported.   Cardiac Risk Factors include: advanced age (>42men, >17 women);diabetes mellitus;male gender;obesity (BMI >30kg/m2);hypertension;family history of premature cardiovascular disease     Objective:    Today's Vitals   08/31/24 1447  Weight: 199 lb (90.3 kg)  Height: 5' 10 (1.778 m)   Body mass index is 28.55 kg/m.     08/31/2024    2:46 PM 06/12/2024    9:53 AM 09/22/2022    1:21 PM 06/12/2014    7:21 AM  Advanced Directives  Does Patient Have a Medical Advance Directive? No Yes No Patient does not have advance directive;Patient would not like information   Type of Customer Service Manager Power of [place];Living will    Would patient like information on creating a medical advance directive? No - Patient declined  No - Patient declined   Pre-existing out of facility DNR order (yellow form or pink MOST form)    No      Data saved with a previous flowsheet row definition    Current Medications (verified) Outpatient Encounter Medications as of 08/31/2024  Medication Sig   allopurinol  (ZYLOPRIM ) 300 MG tablet Take 1 tablet (300 mg total) by mouth daily.   aspirin EC 81 MG tablet Take 81 mg by mouth daily.   clotrimazole -betamethasone  (LOTRISONE ) cream  Apply topically 2 (two) times daily.   Coenzyme Q10 (CO Q 10) 100 MG CAPS Take 1 capsule by mouth daily.   ezetimibe  (ZETIA ) 10 MG tablet Take 1 tablet (10 mg total) by mouth daily.   gabapentin  (NEURONTIN ) 300 MG capsule Take 2 capsules (600 mg total) by mouth 2 (two) times daily.   indomethacin (INDOCIN) 25 MG capsule Take 25 mg by mouth 3 (three) times daily as needed.   metFORMIN  (GLUCOPHAGE ) 500 MG tablet Take 1 tablet (500 mg total) by mouth in the morning, at noon, and at bedtime.   Omega-3 Krill Oil 500 MG CAPS Take 2 capsules by mouth daily.   ONETOUCH ULTRA test strip SMARTSIG:Via Meter   rosuvastatin  (CRESTOR ) 10 MG tablet Take 1 tablet (10 mg total) by mouth daily.   lisinopril -hydrochlorothiazide  (ZESTORETIC ) 10-12.5 MG tablet Take 1 tablet by mouth daily.   No facility-administered encounter medications on file as of 08/31/2024.    Allergies (verified) Patient has no known allergies.   History: Past Medical History:  Diagnosis Date   Diabetes mellitus without complication (HCC)    Gout    Hyperlipemia    Hypertension    Neuropathy    Past Surgical History:  Procedure Laterality Date   APPENDECTOMY     CATARACT EXTRACTION W/PHACO Left 09/14/2022   Procedure: CATARACT EXTRACTION PHACO AND INTRAOCULAR LENS PLACEMENT (IOC) with Goinotomy and iAccess;  Surgeon: Harrie Agent, MD;  Location: [facility];  Service: Ophthalmology;  Laterality: Left;  CDE 6.81   CATARACT EXTRACTION W/PHACO Right  09/28/2022   Procedure: CATARACT EXTRACTION PHACO AND INTRAOCULAR LENS PLACEMENT (IOC);  Surgeon: Harrie Agent, MD;  Location: [facility];  Service: Ophthalmology;  Laterality: Right;  CDE: 8.44   CHOLECYSTECTOMY     COLONOSCOPY  2005   Dr. Shaaron: internal hemorrhoids, diminutive rectal polyp, path not available. Left-sided diverticula   COLONOSCOPY N/A 06/12/2014   Procedure: COLONOSCOPY;  Surgeon: Lamar CHRISTELLA Shaaron, MD;  Location: [facility];  Service: Endoscopy;  Laterality: N/A;  8:45  AM rescheduled to 7:30 - Doris to notify pt   COLONOSCOPY N/A 06/12/2024   Procedure: COLONOSCOPY;  Surgeon: Shaaron Lamar CHRISTELLA, MD;  Location: [facility];  Service: Endoscopy;  Laterality: N/A;  11:15 am, asa 2   GANGLION CYST EXCISION     KNEE ARTHROSCOPY Left    TONSILLECTOMY     Family History  Problem Relation Age of Onset   Hypertension Mother    Heart disease Mother    Leukemia Father    Cancer Maternal Grandmother    Social History   Socioeconomic History   Marital status: Married    Spouse name: Not on file   Number of children: Not on file   Years of education: Not on file   Highest education level: Some college, no degree  Occupational History   Not on file  Tobacco Use   Smoking status: Former    Current packs/day: 0.00    Types: Cigarettes    Quit date: 05/18/2007    Years since quitting: 17.3   Smokeless tobacco: Never   Tobacco comments:    Quit x 7-8 years  Vaping Use   Vaping status: Never Used  Substance and Sexual Activity   Alcohol use: Yes    Comment: occ   Drug use: No   Sexual activity: Not Currently  Other Topics Concern   Not on file  Social History Narrative   Not on file   Social Drivers of Health   Financial Resource Strain: Low Risk  (08/31/2024)   Overall Financial Resource Strain (CARDIA)    Difficulty of Paying Living Expenses: Not hard at all  Food Insecurity: No Food Insecurity (08/31/2024)   Hunger Vital Sign    Worried About Running Out of Food in the Last Year: Never true    Ran Out of Food in the Last Year: Never true  Transportation Needs: No Transportation Needs (08/31/2024)   PRAPARE - Administrator, Civil Service (Medical): No    Lack of Transportation (Non-Medical): No  Physical Activity: Insufficiently Active (08/31/2024)   Exercise Vital Sign    Days of Exercise per Week: 3 days    Minutes of Exercise per Session: 30 min  Stress: No Stress Concern Present (08/31/2024)   [employer] of  Occupational Health - Occupational Stress Questionnaire    Feeling of Stress: Not at all  Social Connections: Socially Integrated (08/31/2024)   Social Connection and Isolation Panel    Frequency of Communication with Friends and Family: Three times a week    Frequency of Social Gatherings with Friends and Family: Three times a week    Attends Religious Services: More than 4 times per year    Active Member of Clubs or Organizations: Yes    Attends Engineer, Structural: More than 4 times per year    Marital Status: Married    Tobacco Counseling Counseling given: Not Answered Tobacco comments: Quit x 7-8 years   Clinical Intake:  Pre-visit preparation completed: Yes  Pain :  No/denies pain     Diabetes: Yes CBG done?: No Did pt. bring in CBG monitor from home?: No  How often do you need to have someone help you when you read instructions, pamphlets, or other written materials from your doctor or pharmacy?: 1 - Never  Interpreter Needed?: No  Information entered by :: Mliss Graff LPN   Activities of Daily Living    08/31/2024    2:47 PM  In your present state of health, do you have any difficulty performing the following activities:  Hearing? 0  Vision? 0  Difficulty concentrating or making decisions? 0  Walking or climbing stairs? 0  Dressing or bathing? 0  Doing errands, shopping? 0  Preparing Food and eating ? N  Using the Toilet? N  In the past six months, have you accidently leaked urine? N  Do you have problems with loss of bowel control? N  Managing your Medications? N  Managing your Finances? N  Housekeeping or managing your Housekeeping? N    Patient Care Team: Duanne Butler DASEN, MD as PCP - General (Family Medicine) Shaaron Lamar HERO, MD as Consulting Physician (Gastroenterology)  Indicate any recent Medical Services you may have received from other than [facility] providers in the past year (date may be approximate).     Assessment:   This is a  routine wellness examination for Seif.  Hearing/Vision screen Hearing Screening - Comments:: No trouble hearing Vision Screening - Comments:: [place]  Up to date [employer] in [place]   Goals Addressed             This Visit's Progress    Patient Stated       Remain independent        Depression Screen    08/31/2024    2:45 PM 07/13/2024    8:55 AM  PHQ 2/9 Scores  PHQ - 2 Score 0 0  PHQ- 9 Score 0     Fall Risk    08/31/2024    2:44 PM 07/13/2024    8:55 AM  Fall Risk   Falls in the past year? 0 0  Number falls in past yr: 0 0  Injury with Fall? 0 0  Risk for fall due to :  No Fall Risks  Follow up Falls evaluation completed;Education provided;Falls prevention discussed Falls evaluation completed    MEDICARE RISK AT HOME: Medicare Risk at Home Any stairs in or around the home?: Yes If so, are there any without handrails?: No Home free of loose throw rugs in walkways, pet beds, electrical cords, etc?: Yes Adequate lighting in your home to reduce risk of falls?: Yes Life alert?: No Use of a cane, walker or w/c?: No Grab bars in the bathroom?: No Shower chair or bench in shower?: No Elevated toilet seat or a handicapped toilet?: Yes  TIMED UP AND GO:  Was the test performed?  No    Cognitive Function:        08/31/2024    2:48 PM  6CIT Screen  What Year? 0 points  What month? 0 points  What time? 0 points  Count back from 20 0 points  Months in reverse 0 points  Repeat phrase 0 points  Total Score 0 points    Immunizations Immunization History  Administered Date(s) Administered   INFLUENZA, HIGH DOSE SEASONAL PF 08/23/2024   Moderna Sars-Covid-2 Vaccination 07/18/2020   PNEUMOCOCCAL CONJUGATE-20 04/19/2024    TDAP status: Due, Education has been provided regarding the importance of  this vaccine. Advised may receive this vaccine at local pharmacy or Health Dept. Aware to provide a copy of the vaccination record if obtained from  local pharmacy or Health Dept. Verbalized acceptance and understanding.  Flu Vaccine status: Up to date  Pneumococcal vaccine status: Up to date  Covid-19 vaccine status: Information provided on how to obtain vaccines.   Qualifies for Shingles Vaccine? Yes   Zostavax completed No   Shingrix Completed?: No.    Education has been provided regarding the importance of this vaccine. Patient has been advised to call insurance company to determine out of pocket expense if they have not yet received this vaccine. Advised may also receive vaccine at local pharmacy or Health Dept. Verbalized acceptance and understanding.  Screening Tests Health Maintenance  Topic Date Due   Diabetic kidney evaluation - Urine ACR  Never done   Hepatitis C Screening  Never done   DTaP/Tdap/Td (1 - Tdap) Never done   Zoster Vaccines- Shingrix (1 of 2) Never done   COVID-19 Vaccine (2 - 2025-26 season) 07/03/2024   Diabetic kidney evaluation - eGFR measurement  07/10/2025   Medicare Annual Wellness (AWV)  08/31/2025   Colonoscopy  06/12/2034   Pneumococcal Vaccine: 50+ Years  Completed   Influenza Vaccine  Completed   Meningococcal B Vaccine  Aged Out    Health Maintenance  Health Maintenance Due  Topic Date Due   Diabetic kidney evaluation - Urine ACR  Never done   Hepatitis C Screening  Never done   DTaP/Tdap/Td (1 - Tdap) Never done   Zoster Vaccines- Shingrix (1 of 2) Never done   COVID-19 Vaccine (2 - 2025-26 season) 07/03/2024    Colorectal cancer screening: No longer required.   Lung Cancer Screening: (Low Dose CT Chest recommended if Age 23-80 years, 20 pack-year currently smoking OR have quit w/in 15years.) does not qualify.   Lung Cancer Screening Referral:   Additional Screening:  Hepatitis C Screening: does not qualify  Vision Screening: Recommended annual ophthalmology exams for early detection of glaucoma and other disorders of the eye. Is the patient up to date with their annual eye  exam?  Yes  Who is the provider or what is the name of the office in which the patient attends annual eye exams? [facility]/ [facility] If pt is not established with a provider, would they like to be referred to a provider to establish care? No .   Dental Screening: Recommended annual dental exams for proper oral hygiene  Nutrition Risk Assessment:  Has the patient had any N/V/D within the last 2 months?  No  Does the patient have any non-healing wounds?  No  Has the patient had any unintentional weight loss or weight gain?  No   Diabetes:  Is the patient diabetic?  Yes  If diabetic, was a CBG obtained today?  No  Did the patient bring in their glucometer from home?  No  How often do you monitor your CBG's? 1 x a day.   Financial Strains and Diabetes Management:  Are you having any financial strains with the device, your supplies or your medication? No .  Does the patient want to be seen by Chronic Care Management for management of their diabetes?  No  Would the patient like to be referred to a Nutritionist or for Diabetic Management?  No   Diabetic Exams:  Diabetic Eye Exam: Completed  Pt has been advised about the importance in completing this exam..  Diabetic  Foot Exam: . Pt has been advised about the importance in completing this exam..    Community Resource Referral / Chronic Care Management: CRR required this visit?  No   CCM required this visit?  No     Plan:     I have personally reviewed and noted the following in the patient's chart:   Medical and social history Use of alcohol, tobacco or illicit drugs  Current medications and supplements including opioid prescriptions. Patient is not currently taking opioid prescriptions. Functional ability and status Nutritional status Physical activity Advanced directives List of other physicians Hospitalizations, surgeries, and ER visits in previous 12 months Vitals Screenings to include cognitive,  depression, and falls Referrals and appointments  In addition, I have reviewed and discussed with patient certain preventive protocols, quality metrics, and best practice recommendations. A written personalized care plan for preventive services as well as general preventive health recommendations were provided to patient.     Mliss Graff, LPN   89/69/7974   After Visit Summary: (MyChart) Due to this being a telephonic visit, the after visit summary with patients personalized plan was offered to patient via MyChart   Nurse Notes:

## 2024-09-04 ENCOUNTER — Ambulatory Visit: Admitting: Cardiovascular Disease

## 2024-10-30 ENCOUNTER — Telehealth: Payer: Self-pay

## 2024-10-30 NOTE — Telephone Encounter (Signed)
 Prescription Request  10/30/2024  LOV: 07/13/24  What is the name of the medication or equipment? gemfibrozil (LOPID) 600 MG tablet [[id number]]  DISCONTINUED   Have you contacted your pharmacy to request a refill? Yes   Which pharmacy would you like this sent to?  [facility] [address], [place] - [address] [place] [address] Phone: [phone] Fax: [phone]    Patient notified that their request is being sent to the clinical staff for review and that they should receive a response within 2 business days.   Please advise at [facility] [phone]

## 2024-10-31 NOTE — Telephone Encounter (Signed)
 Requested Prescriptions  Refused Prescriptions Disp Refills   gemfibrozil (LOPID) 600 MG tablet       Cardiovascular:  Antilipid - Fibric Acid Derivatives Failed - 10/31/2024  5:40 PM      Failed - HGB in normal range and within 360 days    Hemoglobin  Date Value Ref Range Status  07/10/2024 12.4 (L) 13.2 - 17.1 g/dL Final         Failed - HCT in normal range and within 360 days    HCT  Date Value Ref Range Status  07/10/2024 37.6 (L) 38.5 - 50.0 % Final         Failed - Lipid Panel in normal range within the last 12 months    Cholesterol  Date Value Ref Range Status  07/10/2024 89 <200 mg/dL Final   LDL Cholesterol (Calc)  Date Value Ref Range Status  07/10/2024 26 mg/dL (calc) Final    Comment:    Reference range: <100 . Desirable range <100 mg/dL for primary prevention;   <70 mg/dL for patients with CHD or diabetic patients  with > or = 2 CHD risk factors. SABRA LDL-C is now calculated using the Martin-Hopkins  calculation, which is a validated novel method providing  better accuracy than the Friedewald equation in the  estimation of LDL-C.  Gladis APPLETHWAITE et al. SANDREA. 7986;689(80): 2061-2068  (http://education.[facility].com/faq/FAQ164)    HDL  Date Value Ref Range Status  07/10/2024 43 > OR = 40 mg/dL Final   Triglycerides  Date Value Ref Range Status  07/10/2024 112 <150 mg/dL Final         Passed - ALT in normal range and within 360 days    ALT  Date Value Ref Range Status  07/10/2024 27 9 - 46 U/L Final         Passed - AST in normal range and within 360 days    AST  Date Value Ref Range Status  07/10/2024 25 10 - 35 U/L Final         Passed - Cr in normal range and within 360 days    Creat  Date Value Ref Range Status  07/10/2024 1.12 0.70 - 1.28 mg/dL Final         Passed - PLT in normal range and within 360 days    Platelets  Date Value Ref Range Status  07/10/2024 165 140 - 400 Thousand/uL Final         Passed - WBC in normal range  and within 360 days    WBC  Date Value Ref Range Status  07/10/2024 5.9 3.8 - 10.8 Thousand/uL Final         Passed - eGFR is 30 or above and within 360 days    eGFR  Date Value Ref Range Status  07/10/2024 69 > OR = 60 mL/min/1.63m2 Final  05/26/2024 59 (L) >59 mL/min/1.73 Final         Passed - Valid encounter within last 12 months    Recent Outpatient Visits           3 months ago Controlled type 2 diabetes mellitus with complication, without long-term current use of insulin (HCC)   [facility] Pickard, Butler DASEN, MD   5 months ago Colon cancer screening   [place] [facility] Pickard, Butler DASEN, MD

## 2024-11-13 ENCOUNTER — Other Ambulatory Visit

## 2024-11-16 ENCOUNTER — Other Ambulatory Visit

## 2024-11-16 DIAGNOSIS — I10 Essential (primary) hypertension: Secondary | ICD-10-CM

## 2024-11-16 DIAGNOSIS — E538 Deficiency of other specified B group vitamins: Secondary | ICD-10-CM

## 2024-11-16 DIAGNOSIS — E785 Hyperlipidemia, unspecified: Secondary | ICD-10-CM

## 2024-11-16 DIAGNOSIS — E118 Type 2 diabetes mellitus with unspecified complications: Secondary | ICD-10-CM

## 2024-11-17 ENCOUNTER — Ambulatory Visit: Payer: Self-pay | Admitting: Family Medicine

## 2024-11-17 ENCOUNTER — Telehealth: Payer: Self-pay

## 2024-11-17 MED ORDER — LISINOPRIL 20 MG PO TABS: 20.0000 mg | ORAL_TABLET | Freq: Every day | ORAL | 3 refills | Status: AC

## 2024-11-17 NOTE — Telephone Encounter (Signed)
 Copied from CRM [id number]. Topic: Clinical - Lab/Test Results >> Nov 17, 2024 10:26 AM Myrick T wrote: Reason for CRM: patient returning [facility] call. Please f/u with patient

## 2024-11-17 NOTE — Telephone Encounter (Signed)
 Pt. Called back . Results given and f/u scheduled.

## 2024-11-17 NOTE — Telephone Encounter (Signed)
 Copied from CRM [id number]. Topic: Clinical - Lab/Test Results >> Nov 17, 2024 10:26 AM Myrick T wrote: Reason for CRM: patient returning [facility] call. Please f/u with patient >> Nov 17, 2024  2:09 PM Mia F wrote: Pt returning [facility] Sheena a call back about his labs. Per CAL Sheena is at lunch and will call pt back

## 2024-11-19 ENCOUNTER — Other Ambulatory Visit: Payer: Self-pay | Admitting: Family Medicine

## 2024-11-21 LAB — COMPLETE METABOLIC PANEL WITHOUT GFR
AG Ratio: 1.3 (calc) (ref 1.0–2.5)
ALT: 40 U/L (ref 9–46)
AST: 32 U/L (ref 10–35)
Albumin: 4 g/dL (ref 3.6–5.1)
Alkaline phosphatase (APISO): 65 U/L (ref 35–144)
BUN: 16 mg/dL (ref 7–25)
CO2: 27 mmol/L (ref 20–32)
Calcium: 9.6 mg/dL (ref 8.6–10.3)
Chloride: 91 mmol/L — ABNORMAL LOW (ref 98–110)
Creat: 0.9 mg/dL (ref 0.70–1.28)
Globulin: 3.1 g/dL (ref 1.9–3.7)
Glucose, Bld: 212 mg/dL — ABNORMAL HIGH (ref 65–99)
Potassium: 4.7 mmol/L (ref 3.5–5.3)
Sodium: 127 mmol/L — ABNORMAL LOW (ref 135–146)
Total Bilirubin: 0.5 mg/dL (ref 0.2–1.2)
Total Protein: 7.1 g/dL (ref 6.1–8.1)

## 2024-11-21 LAB — LIPID PANEL
Cholesterol: 66 mg/dL
HDL: 38 mg/dL — ABNORMAL LOW
LDL Cholesterol (Calc): <10 mg/dL
Non-HDL Cholesterol (Calc): 28 mg/dL
Total CHOL/HDL Ratio: 1.7 (calc)
Triglycerides: 133 mg/dL

## 2024-11-21 LAB — CBC WITH DIFFERENTIAL/PLATELET
Absolute Lymphocytes: 1815 {cells}/uL (ref 850–3900)
Absolute Monocytes: 812 {cells}/uL (ref 200–950)
Basophils Absolute: 33 {cells}/uL (ref 0–200)
Basophils Relative: 0.5 %
Eosinophils Absolute: 92 {cells}/uL (ref 15–500)
Eosinophils Relative: 1.4 %
HCT: 37.6 % — ABNORMAL LOW (ref 39.4–51.1)
Hemoglobin: 12.4 g/dL — ABNORMAL LOW (ref 13.2–17.1)
MCH: 32.7 pg (ref 27.0–33.0)
MCHC: 33 g/dL (ref 31.6–35.4)
MCV: 99.2 fL (ref 81.4–101.7)
MPV: 9.5 fL (ref 7.5–12.5)
Monocytes Relative: 12.3 %
Neutro Abs: 3848 {cells}/uL (ref 1500–7800)
Neutrophils Relative %: 58.3 %
Platelets: 225 Thousand/uL (ref 140–400)
RBC: 3.79 Million/uL — ABNORMAL LOW (ref 4.20–5.80)
RDW: 12.8 % (ref 11.0–15.0)
Total Lymphocyte: 27.5 %
WBC: 6.6 Thousand/uL (ref 3.8–10.8)

## 2024-11-21 LAB — MICROALBUMIN / CREATININE URINE RATIO
Creatinine, Urine: 52 mg/dL (ref 20–320)
Microalb Creat Ratio: 4 mg/g{creat}
Microalb, Ur: 0.2 mg/dL

## 2024-11-21 LAB — HEMOGLOBIN A1C
Hgb A1c MFr Bld: 8.3 % — ABNORMAL HIGH
Mean Plasma Glucose: 192 mg/dL
eAG (mmol/L): 10.6 mmol/L

## 2024-11-21 LAB — VITAMIN B12: Vitamin B-12: 343 pg/mL (ref 200–1100)

## 2024-11-24 ENCOUNTER — Other Ambulatory Visit

## 2024-11-24 DIAGNOSIS — E871 Hypo-osmolality and hyponatremia: Secondary | ICD-10-CM

## 2024-11-24 LAB — COMPLETE METABOLIC PANEL WITHOUT GFR
AG Ratio: 1.4 (calc) (ref 1.0–2.5)
ALT: 33 U/L (ref 9–46)
AST: 24 U/L (ref 10–35)
Albumin: 4 g/dL (ref 3.6–5.1)
Alkaline phosphatase (APISO): 49 U/L (ref 35–144)
BUN: 17 mg/dL (ref 7–25)
CO2: 28 mmol/L (ref 20–32)
Calcium: 9.3 mg/dL (ref 8.6–10.3)
Chloride: 94 mmol/L — ABNORMAL LOW (ref 98–110)
Creat: 0.89 mg/dL (ref 0.70–1.28)
Globulin: 2.9 g/dL (ref 1.9–3.7)
Glucose, Bld: 177 mg/dL — ABNORMAL HIGH (ref 65–99)
Potassium: 4.9 mmol/L (ref 3.5–5.3)
Sodium: 130 mmol/L — ABNORMAL LOW (ref 135–146)
Total Bilirubin: 0.5 mg/dL (ref 0.2–1.2)
Total Protein: 6.9 g/dL (ref 6.1–8.1)

## 2024-11-28 ENCOUNTER — Encounter: Payer: Self-pay | Admitting: Family Medicine

## 2024-11-28 ENCOUNTER — Ambulatory Visit: Payer: Self-pay | Admitting: Family Medicine

## 2024-11-28 ENCOUNTER — Ambulatory Visit: Admitting: Family Medicine

## 2024-11-28 VITALS — BP 130/62 | HR 68 | Ht 70.0 in | Wt 196.0 lb

## 2024-11-28 DIAGNOSIS — E1165 Type 2 diabetes mellitus with hyperglycemia: Secondary | ICD-10-CM

## 2024-11-28 DIAGNOSIS — E1140 Type 2 diabetes mellitus with diabetic neuropathy, unspecified: Secondary | ICD-10-CM

## 2024-11-28 DIAGNOSIS — E871 Hypo-osmolality and hyponatremia: Secondary | ICD-10-CM | POA: Insufficient documentation

## 2024-11-28 MED ORDER — GLIPIZIDE ER 2.5 MG PO TB24: 2.5000 mg | ORAL_TABLET | Freq: Every day | ORAL | 3 refills | Status: AC

## 2024-11-28 NOTE — Progress Notes (Addendum)
 "  Subjective:    Patient ID: Bruce Abbott, male    DOB: [birth year], 74 y.o.   MRN: [id number]  HPI Patient is a 74 year old Caucasian gentleman here today for discussion about hyponatremia and his diabetes.  Recently his sodium dropped to 127.  As result we had the patient discontinue lisinopril  hydrochlorothiazide  and replace just with lisinopril .  His sodium has rebounded to 130.  Previous sodium levels have been around 134.  Therefore I believe that he has SIADH.  His most recent A1c was also 8.3 despite taking metformin  2000 mg a day.  He is here today to discuss his options.  We discussed Jardiance/Farxiga, Ozempic/Mounjaro, Januvia, Actos, and glipizide .  We discussed the pros and cons of each of these options. Lab on 11/24/2024  Component Date Value Ref Range Status   Glucose, Bld 11/24/2024 177 (H)  65 - 99 mg/dL Final   Comment: .            Fasting reference interval . For someone without known diabetes, a glucose value >125 mg/dL indicates that they may have diabetes and this should be confirmed with a follow-up test. .    BUN 11/24/2024 17  7 - 25 mg/dL Final   Creat 98/76/7973 0.89  0.70 - 1.28 mg/dL Final   BUN/Creatinine Ratio 11/24/2024 SEE NOTE:  6 - 22 (calc) Final   Comment:    Not Reported: BUN and Creatinine are within    reference range. .    Sodium 11/24/2024 130 (L)  135 - 146 mmol/L Final   Potassium 11/24/2024 4.9  3.5 - 5.3 mmol/L Final   Chloride 11/24/2024 94 (L)  98 - 110 mmol/L Final   CO2 11/24/2024 28  20 - 32 mmol/L Final   Calcium  11/24/2024 9.3  8.6 - 10.3 mg/dL Final   Total Protein 98/76/7973 6.9  6.1 - 8.1 g/dL Final   Albumin 98/76/7973 4.0  3.6 - 5.1 g/dL Final   Globulin 98/76/7973 2.9  1.9 - 3.7 g/dL (calc) Final   AG Ratio 11/24/2024 1.4  1.0 - 2.5 (calc) Final   Total Bilirubin 11/24/2024 0.5  0.2 - 1.2 mg/dL Final   Alkaline phosphatase (APISO) 11/24/2024 49  35 - 144 U/L Final   AST 11/24/2024 24  10 - 35 U/L Final   ALT 11/24/2024  33  9 - 46 U/L Final  Lab on 11/16/2024  Component Date Value Ref Range Status   WBC 11/16/2024 6.6  3.8 - 10.8 Thousand/uL Final   RBC 11/16/2024 3.79 (L)  4.20 - 5.80 Million/uL Final   Hemoglobin 11/16/2024 12.4 (L)  13.2 - 17.1 g/dL Final   HCT 98/84/7973 37.6 (L)  39.4 - 51.1 % Final   MCV 11/16/2024 99.2  81.4 - 101.7 fL Final   MCH 11/16/2024 32.7  27.0 - 33.0 pg Final   MCHC 11/16/2024 33.0  31.6 - 35.4 g/dL Final   RDW 98/84/7973 12.8  11.0 - 15.0 % Final   Platelets 11/16/2024 225  140 - 400 Thousand/uL Final   MPV 11/16/2024 9.5  7.5 - 12.5 fL Final   Neutro Abs 11/16/2024 3,848  1,500 - 7,800 cells/uL Final   Absolute Lymphocytes 11/16/2024 1,815  850 - 3,900 cells/uL Final   Absolute Monocytes 11/16/2024 812  200 - 950 cells/uL Final   Eosinophils Absolute 11/16/2024 92  15 - 500 cells/uL Final   Basophils Absolute 11/16/2024 33  0 - 200 cells/uL Final   Neutrophils Relative % 11/16/2024 58.3  % Final  Total Lymphocyte 11/16/2024 27.5  % Final   Monocytes Relative 11/16/2024 12.3  % Final   Eosinophils Relative 11/16/2024 1.4  % Final   Basophils Relative 11/16/2024 0.5  % Final   Glucose, Bld 11/16/2024 212 (H)  65 - 99 mg/dL Final   Comment: .            Fasting reference interval . For someone without known diabetes, a glucose value >125 mg/dL indicates that they may have diabetes and this should be confirmed with a follow-up test. .    BUN 11/16/2024 16  7 - 25 mg/dL Final   Creat 98/84/7973 0.90  0.70 - 1.28 mg/dL Final   BUN/Creatinine Ratio 11/16/2024 SEE NOTE:  6 - 22 (calc) Final   Comment:    Not Reported: BUN and Creatinine are within    reference range. .    Sodium 11/16/2024 127 (L)  135 - 146 mmol/L Final   Potassium 11/16/2024 4.7  3.5 - 5.3 mmol/L Final   Chloride 11/16/2024 91 (L)  98 - 110 mmol/L Final   CO2 11/16/2024 27  20 - 32 mmol/L Final   Calcium  11/16/2024 9.6  8.6 - 10.3 mg/dL Final   Total Protein 98/84/7973 7.1  6.1 - 8.1 g/dL  Final   Albumin 98/84/7973 4.0  3.6 - 5.1 g/dL Final   Globulin 98/84/7973 3.1  1.9 - 3.7 g/dL (calc) Final   AG Ratio 11/16/2024 1.3  1.0 - 2.5 (calc) Final   Total Bilirubin 11/16/2024 0.5  0.2 - 1.2 mg/dL Final   Alkaline phosphatase (APISO) 11/16/2024 65  35 - 144 U/L Final   AST 11/16/2024 32  10 - 35 U/L Final   ALT 11/16/2024 40  9 - 46 U/L Final   Hgb A1c MFr Bld 11/16/2024 8.3 (H)  <5.7 % Final   Comment: For someone without known diabetes, a hemoglobin A1c value of 6.5% or greater indicates that they may have  diabetes and this should be confirmed with a follow-up  test. . For someone with known diabetes, a value <7% indicates  that their diabetes is well controlled and a value  greater than or equal to 7% indicates suboptimal  control. A1c targets should be individualized based on  duration of diabetes, age, comorbid conditions, and  other considerations. . Currently, no consensus exists regarding use of hemoglobin A1c for diagnosis of diabetes for children. .    Mean Plasma Glucose 11/16/2024 192  mg/dL Final   eAG (mmol/L) 98/84/7973 10.6  mmol/L Final   Cholesterol 11/16/2024 66  <200 mg/dL Final   HDL 98/84/7973 38 (L)  > OR = 40 mg/dL Final   Triglycerides 98/84/7973 133  <150 mg/dL Final   LDL Cholesterol (Calc) 11/16/2024 <10  mg/dL (calc) Final   Comment: Reference range: <100 . Desirable range <100 mg/dL for primary prevention;   <70 mg/dL for patients with CHD or diabetic patients  with > or = 2 CHD risk factors. SABRA LDL-C is now calculated using the Martin-Hopkins  calculation, which is a validated novel method providing  better accuracy than the Friedewald equation in the  estimation of LDL-C.  Gladis APPLETHWAITE et al. SANDREA. 7986;689(80): 2061-2068  (http://education.[facility].com/faq/FAQ164)    Total CHOL/HDL Ratio 11/16/2024 1.7  <4.9 (calc) Final   Non-HDL Cholesterol (Calc) 11/16/2024 28  <130 mg/dL (calc) Final   Comment: For patients with  diabetes plus 1 major ASCVD risk  factor, treating to a non-HDL-C goal of <100 mg/dL  (LDL-C of <29 mg/dL)  is considered a therapeutic  option.    Creatinine, Urine 11/16/2024 52  20 - 320 mg/dL Final   Microalb, Ur 98/84/7973 0.2  mg/dL Final   Comment: Reference Range Not established    Microalb Creat Ratio 11/16/2024 4  <30 mg/g creat Final   Comment: . The ADA defines abnormalities in albumin excretion as follows: SABRA Albuminuria Category        Result (mg/g creatinine) . Normal to Mildly increased   <30 Moderately increased         30-299  Severely increased           > OR = 300 . The ADA recommends that at least two of three specimens collected within a 3-6 month period be abnormal before considering a patient to be within a diagnostic category.    Vitamin B-12 11/16/2024 343  200 - 1,100 pg/mL Final   Comment: . Please Note: Although the reference range for vitamin B12 is [phone] pg/mL, it has been reported that between 5 and 10% of patients with values between 200 and 400 pg/mL may experience neuropsychiatric and hematologic abnormalities due to occult B12 deficiency; less than 1% of patients with values above 400 pg/mL will have symptoms. .     Past Medical History:  Diagnosis Date   Diabetes mellitus without complication (HCC)    Gout    Hyperlipemia    Hypertension    Neuropathy    Past Surgical History:  Procedure Laterality Date   APPENDECTOMY     CATARACT EXTRACTION W/PHACO Left 09/14/2022   Procedure: CATARACT EXTRACTION PHACO AND INTRAOCULAR LENS PLACEMENT (IOC) with Goinotomy and iAccess;  Surgeon: Harrie Agent, MD;  Location: [facility];  Service: Ophthalmology;  Laterality: Left;  CDE 6.81   CATARACT EXTRACTION W/PHACO Right 09/28/2022   Procedure: CATARACT EXTRACTION PHACO AND INTRAOCULAR LENS PLACEMENT (IOC);  Surgeon: Harrie Agent, MD;  Location: [facility];  Service: Ophthalmology;  Laterality: Right;  CDE: 8.44   CHOLECYSTECTOMY     COLONOSCOPY   2005   Dr. Shaaron: internal hemorrhoids, diminutive rectal polyp, path not available. Left-sided diverticula   COLONOSCOPY N/A 06/12/2014   Procedure: COLONOSCOPY;  Surgeon: Lamar CHRISTELLA Shaaron, MD;  Location: [facility];  Service: Endoscopy;  Laterality: N/A;  8:45 AM rescheduled to 7:30 - Doris to notify pt   COLONOSCOPY N/A 06/12/2024   Procedure: COLONOSCOPY;  Surgeon: Shaaron Lamar CHRISTELLA, MD;  Location: [facility];  Service: Endoscopy;  Laterality: N/A;  11:15 am, asa 2   GANGLION CYST EXCISION     KNEE ARTHROSCOPY Left    TONSILLECTOMY     Current Outpatient Medications on File Prior to Visit  Medication Sig Dispense Refill   allopurinol  (ZYLOPRIM ) 300 MG tablet Take 1 tablet (300 mg total) by mouth daily. 90 tablet 1   aspirin EC 81 MG tablet Take 81 mg by mouth daily.     clotrimazole -betamethasone  (LOTRISONE ) cream Apply topically 2 (two) times daily. 30 g 1   Coenzyme Q10 (CO Q 10) 100 MG CAPS Take 1 capsule by mouth daily.     ezetimibe  (ZETIA ) 10 MG tablet Take 1 tablet (10 mg total) by mouth daily. 90 tablet 1   gabapentin  (NEURONTIN ) 300 MG capsule Take 2 capsules (600 mg total) by mouth 2 (two) times daily. 120 capsule 11   indomethacin (INDOCIN) 25 MG capsule Take 25 mg by mouth 3 (three) times daily as needed.     lisinopril  (ZESTRIL ) 20 MG tablet Take 1 tablet (20 mg  total) by mouth daily. 90 tablet 3   metFORMIN  (GLUCOPHAGE ) 500 MG tablet Take 1 tablet (500 mg total) by mouth in the morning, at noon, and at bedtime. 90 tablet 1   Omega-3 Krill Oil 500 MG CAPS Take 2 capsules by mouth daily.     ONETOUCH ULTRA test strip SMARTSIG:Via Meter     rosuvastatin  (CRESTOR ) 10 MG tablet Take 1 tablet by mouth once daily 90 tablet 0   No current facility-administered medications on file prior to visit.   No Known Allergies Social History   Socioeconomic History   Marital status: Married    Spouse name: Not on file   Number of children: Not on file   Years of education: Not on  file   Highest education level: Some college, no degree  Occupational History   Not on file  Tobacco Use   Smoking status: Former    Current packs/day: 0.00    Types: Cigarettes    Quit date: 05/18/2007    Years since quitting: 17.5   Smokeless tobacco: Never   Tobacco comments:    Quit x 7-8 years  Vaping Use   Vaping status: Never Used  Substance and Sexual Activity   Alcohol use: Yes    Comment: occ   Drug use: No   Sexual activity: Not Currently  Other Topics Concern   Not on file  Social History Narrative   Not on file   Social Drivers of Health   Tobacco Use: Medium Risk (11/28/2024)   Patient History    Smoking Tobacco Use: Former    Smokeless Tobacco Use: Never    Passive Exposure: Not on Actuary Strain: Low Risk (08/31/2024)   Overall Financial Resource Strain (CARDIA)    Difficulty of Paying Living Expenses: Not hard at all  Food Insecurity: No Food Insecurity (08/31/2024)   Epic    Worried About Programme Researcher, Broadcasting/film/video in the Last Year: Never true    Ran Out of Food in the Last Year: Never true  Transportation Needs: No Transportation Needs (08/31/2024)   Epic    Lack of Transportation (Medical): No    Lack of Transportation (Non-Medical): No  Physical Activity: Insufficiently Active (08/31/2024)   Exercise Vital Sign    Days of Exercise per Week: 3 days    Minutes of Exercise per Session: 30 min  Stress: No Stress Concern Present (08/31/2024)   [employer] of Occupational Health - Occupational Stress Questionnaire    Feeling of Stress: Not at all  Social Connections: Socially Integrated (08/31/2024)   Social Connection and Isolation Panel    Frequency of Communication with Friends and Family: Three times a week    Frequency of Social Gatherings with Friends and Family: Three times a week    Attends Religious Services: More than 4 times per year    Active Member of Clubs or Organizations: Yes    Attends Banker Meetings:  More than 4 times per year    Marital Status: Married  Catering Manager Violence: Not At Risk (08/31/2024)   Epic    Fear of Current or Ex-Partner: No    Emotionally Abused: No    Physically Abused: No    Sexually Abused: No  Depression (PHQ2-9): Low Risk (08/31/2024)   Depression (PHQ2-9)    PHQ-2 Score: 0  Alcohol Screen: Low Risk (08/31/2024)   Alcohol Screen    Last Alcohol Screening Score (AUDIT): 1  Housing: Unknown (08/31/2024)   Epic  Unable to Pay for Housing in the Last Year: No    Number of Times Moved in the Last Year: Not on file    Homeless in the Last Year: No  Utilities: Not At Risk (08/31/2024)   Epic    Threatened with loss of utilities: No  Health Literacy: Adequate Health Literacy (08/31/2024)   B1300 Health Literacy    Frequency of need for help with medical instructions: Never   Family History  Problem Relation Age of Onset   Hypertension Mother    Heart disease Mother    Leukemia Father    Cancer Maternal Grandmother       Review of Systems  All other systems reviewed and are negative.      Objective:   Physical Exam Vitals reviewed.  Constitutional:      General: He is not in acute distress.    Appearance: Normal appearance. He is normal weight. He is not ill-appearing or toxic-appearing.  HENT:     Head: Normocephalic and atraumatic.     Right Ear: Tympanic membrane and ear canal normal.     Left Ear: Tympanic membrane and ear canal normal.     Nose: Nose normal. No congestion or rhinorrhea.     Mouth/Throat:     Mouth: Mucous membranes are moist.     Pharynx: Oropharynx is clear. No oropharyngeal exudate or posterior oropharyngeal erythema.  Eyes:     General:        Right eye: No discharge.        Left eye: No discharge.     Extraocular Movements: Extraocular movements intact.     Conjunctiva/sclera: Conjunctivae normal.     Pupils: Pupils are equal, round, and reactive to light.  Neck:     Vascular: No carotid bruit.   Cardiovascular:     Rate and Rhythm: Normal rate and regular rhythm.     Pulses: Normal pulses.     Heart sounds: Normal heart sounds. No murmur heard.    No gallop.  Pulmonary:     Effort: Pulmonary effort is normal. No respiratory distress.     Breath sounds: Normal breath sounds. No stridor. No wheezing, rhonchi or rales.  Abdominal:     General: Bowel sounds are normal. There is no distension.     Palpations: There is no mass.     Tenderness: There is no abdominal tenderness. There is no guarding or rebound.     Hernia: No hernia is present.  Musculoskeletal:     Cervical back: Normal range of motion and neck supple.     Right lower leg: No edema.     Left lower leg: No edema.  Lymphadenopathy:     Cervical: No cervical adenopathy.  Skin:    General: Skin is warm.     Coloration: Skin is not jaundiced.     Findings: No bruising, erythema, lesion or rash.  Neurological:     General: No focal deficit present.     Mental Status: He is alert and oriented to person, place, and time. Mental status is at baseline.     Cranial Nerves: No cranial nerve deficit.     Motor: No weakness.     Coordination: Coordination normal.     Gait: Gait normal.     Deep Tendon Reflexes: Reflexes normal.  Psychiatric:        Mood and Affect: Mood normal.        Behavior: Behavior normal.  Assessment & Plan:  Hyponatremia  Uncontrolled type 2 diabetes mellitus with hyperglycemia [facility])  Patient elects to start glipizide  extended release 2.5 mg daily and continue metformin  and recheck labs in 3 months.  We also discussed SIADH.  I recommended restricting his fluid to approximately 1200 mL a day unless he is physically active. "

## 2024-12-01 ENCOUNTER — Ambulatory Visit: Admitting: Family Medicine

## 2025-03-05 ENCOUNTER — Other Ambulatory Visit

## 2025-09-06 ENCOUNTER — Ambulatory Visit
